# Patient Record
Sex: Male | Born: 1935 | Race: White | Hispanic: No | Marital: Married | Smoking: Never smoker
Health system: Southern US, Community
[De-identification: ages and names within clinical notes are randomized; demographics above are authoritative.]

## PROBLEM LIST (undated history)

## (undated) DIAGNOSIS — I447 Left bundle-branch block, unspecified: Secondary | ICD-10-CM

## (undated) DIAGNOSIS — C61 Malignant neoplasm of prostate: Secondary | ICD-10-CM

## (undated) DIAGNOSIS — M1990 Unspecified osteoarthritis, unspecified site: Secondary | ICD-10-CM

## (undated) DIAGNOSIS — I2510 Atherosclerotic heart disease of native coronary artery without angina pectoris: Secondary | ICD-10-CM

## (undated) DIAGNOSIS — Z9289 Personal history of other medical treatment: Secondary | ICD-10-CM

## (undated) HISTORY — PX: ROOT CANAL: SHX2363

## (undated) HISTORY — PX: TONSILLECTOMY: SUR1361

## (undated) HISTORY — PX: LASIK: SHX215

## (undated) HISTORY — PX: APPENDECTOMY: SHX54

## (undated) HISTORY — PX: RADIOACTIVE SEED IMPLANT: SHX5150

## (undated) HISTORY — DX: Personal history of other medical treatment: Z92.89

## (undated) HISTORY — PX: CATARACT EXTRACTION: SUR2

## (undated) HISTORY — DX: Malignant neoplasm of prostate: C61

---

## 1999-09-06 ENCOUNTER — Other Ambulatory Visit: Admission: RE | Admit: 1999-09-06 | Discharge: 1999-09-06 | Payer: Self-pay | Admitting: Urology

## 1999-10-24 ENCOUNTER — Ambulatory Visit (HOSPITAL_COMMUNITY): Admission: RE | Admit: 1999-10-24 | Discharge: 1999-10-24 | Payer: Self-pay | Admitting: Gastroenterology

## 1999-10-29 ENCOUNTER — Inpatient Hospital Stay (HOSPITAL_COMMUNITY): Admission: EM | Admit: 1999-10-29 | Discharge: 1999-10-31 | Payer: Self-pay | Admitting: *Deleted

## 2000-01-23 ENCOUNTER — Other Ambulatory Visit: Admission: RE | Admit: 2000-01-23 | Discharge: 2000-01-23 | Payer: Self-pay | Admitting: Urology

## 2000-07-01 ENCOUNTER — Other Ambulatory Visit: Admission: RE | Admit: 2000-07-01 | Discharge: 2000-07-01 | Payer: Self-pay | Admitting: Urology

## 2000-09-22 ENCOUNTER — Ambulatory Visit (HOSPITAL_COMMUNITY): Admission: RE | Admit: 2000-09-22 | Discharge: 2000-09-22 | Payer: Self-pay | Admitting: Radiation Oncology

## 2000-10-10 ENCOUNTER — Encounter: Payer: Self-pay | Admitting: Urology

## 2000-10-10 ENCOUNTER — Ambulatory Visit (HOSPITAL_COMMUNITY): Admission: RE | Admit: 2000-10-10 | Discharge: 2000-10-10 | Payer: Self-pay | Admitting: Radiation Oncology

## 2000-10-17 ENCOUNTER — Ambulatory Visit (HOSPITAL_BASED_OUTPATIENT_CLINIC_OR_DEPARTMENT_OTHER): Admission: RE | Admit: 2000-10-17 | Discharge: 2000-10-17 | Payer: Self-pay | Admitting: Urology

## 2000-10-17 ENCOUNTER — Encounter: Payer: Self-pay | Admitting: Urology

## 2000-11-07 ENCOUNTER — Ambulatory Visit (HOSPITAL_COMMUNITY): Admission: RE | Admit: 2000-11-07 | Discharge: 2000-11-07 | Payer: Self-pay | Admitting: Radiation Oncology

## 2000-11-07 ENCOUNTER — Encounter: Admission: RE | Admit: 2000-11-07 | Discharge: 2001-02-24 | Payer: Self-pay | Admitting: Radiation Oncology

## 2005-12-18 ENCOUNTER — Other Ambulatory Visit: Admission: RE | Admit: 2005-12-18 | Discharge: 2005-12-18 | Payer: Self-pay | Admitting: Otolaryngology

## 2011-08-30 ENCOUNTER — Emergency Department (HOSPITAL_COMMUNITY)
Admission: EM | Admit: 2011-08-30 | Discharge: 2011-08-31 | Disposition: A | Discharge status: Home / Self Care | Payer: Medicare Other | Attending: Emergency Medicine | Admitting: Emergency Medicine

## 2011-08-30 ENCOUNTER — Emergency Department (INDEPENDENT_AMBULATORY_CARE_PROVIDER_SITE_OTHER): Payer: Medicare Other

## 2011-08-30 ENCOUNTER — Emergency Department (HOSPITAL_COMMUNITY): Payer: Medicare Other

## 2011-08-30 ENCOUNTER — Other Ambulatory Visit: Payer: Self-pay

## 2011-08-30 ENCOUNTER — Encounter (HOSPITAL_COMMUNITY): Payer: Self-pay | Admitting: *Deleted

## 2011-08-30 ENCOUNTER — Emergency Department (INDEPENDENT_AMBULATORY_CARE_PROVIDER_SITE_OTHER)
Admission: EM | Admit: 2011-08-30 | Discharge: 2011-08-30 | Disposition: A | Discharge status: Other Acute Inpatient Hospital | Payer: Medicare Other | Source: Home / Self Care

## 2011-08-30 DIAGNOSIS — M25519 Pain in unspecified shoulder: Secondary | ICD-10-CM | POA: Insufficient documentation

## 2011-08-30 DIAGNOSIS — R0789 Other chest pain: Secondary | ICD-10-CM

## 2011-08-30 DIAGNOSIS — R079 Chest pain, unspecified: Secondary | ICD-10-CM | POA: Insufficient documentation

## 2011-08-30 DIAGNOSIS — R071 Chest pain on breathing: Secondary | ICD-10-CM

## 2011-08-30 DIAGNOSIS — M79609 Pain in unspecified limb: Secondary | ICD-10-CM | POA: Insufficient documentation

## 2011-08-30 DIAGNOSIS — Z7982 Long term (current) use of aspirin: Secondary | ICD-10-CM | POA: Insufficient documentation

## 2011-08-30 LAB — DIFFERENTIAL
Basophils Absolute: 0 10*3/uL (ref 0.0–0.1)
Basophils Relative: 0 % (ref 0–1)
Eosinophils Absolute: 0.1 10*3/uL (ref 0.0–0.7)
Neutrophils Relative %: 57 % (ref 43–77)

## 2011-08-30 LAB — CBC
MCH: 31.9 pg (ref 26.0–34.0)
MCHC: 35.1 g/dL (ref 30.0–36.0)
Platelets: 245 10*3/uL (ref 150–400)
RBC: 4.64 MIL/uL (ref 4.22–5.81)
RDW: 12.6 % (ref 11.5–15.5)

## 2011-08-30 LAB — BASIC METABOLIC PANEL
GFR calc Af Amer: 87 mL/min — ABNORMAL LOW (ref >90)
GFR calc non Af Amer: 75 mL/min — ABNORMAL LOW (ref >90)
Potassium: 4.9 mEq/L (ref 3.5–5.1)
Sodium: 140 mEq/L (ref 135–145)

## 2011-08-30 LAB — CARDIAC PANEL(CRET KIN+CKTOT+MB+TROPI)
CK, MB: 3.7 ng/mL (ref 0.3–4.0)
Troponin I: <0.3 ng/mL (ref <0.30)

## 2011-08-30 NOTE — ED Notes (Signed)
patient c/o pain in left flank , left elbow w motion; c/o pain wakes him ~ 3 am every night, and he takes an ASA and goes back to bed ; last night he had pain w ROM, denies N/v/SOB w his discomfort; NAD at present

## 2011-08-30 NOTE — ED Notes (Signed)
Pt reports left shoulder/arm pain that occurs in the a.m. For approx 3 weeks, pt states pain improves over the course of the day. Pt reports this a.m. He awoke w/ the same pain to left shoulder and arm at which point he went to [facility]. Pt denies any diaphoresis, n/v, or shortness of breath w/ these episodes of pain. Pt in no acute distress at present, resp even and unlabored. Pt referred here from [facility] d/t BBB on EKG which has changed from his EKG in 2001.

## 2011-08-30 NOTE — ED Notes (Addendum)
Patient reports duration of several weekss pain in left flank/left lateral chest and pain in left arm that is worse w movement; NAD, w/d/color good

## 2011-08-30 NOTE — ED Provider Notes (Signed)
History     CSN: [id number] Arrival date & time: 08/30/2011  7:27 PM   First MD Initiated Contact with Patient 08/30/11 1933      Chief Complaint  Patient presents with  . Referral    (Consider location/radiation/quality/duration/timing/severity/associated sxs/prior treatment) The history is provided by the spouse and the patient.   the patient is an 77 year old male, with no history of coronary artery disease, diabetes, or hypertension, who presents to the emergency department with left-sided chest pain.  For the past several days.  It usually occurs in the morning.  He states it is dull.  This morning.  It radiated into his left shoulder and arm.  He was seen by her primary care physician, who did the EKG showing a left bundle branch block, and he was sent here for further evaluation.  The patient denies any symptoms at this time.  The patient denies nausea, vomiting, shortness of breath, or diaphoresis.  When he is having the chest pain.  Chest pain, generally goes away if he takes aspirin or goes back to sleep.  He denies a cough, fevers, chills, leg pain or swelling.  He denies recent travel or surgery.  He takes no medications except for aspirin.  Past Medical History  Diagnosis Date  . Cancer     Past Surgical History  Procedure Date  . Appendectomy     No family history on file.  History  Substance Use Topics  . Smoking status: Not on file  . Smokeless tobacco: Not on file  . Alcohol Use:       Review of Systems  Constitutional: Negative for fever and diaphoresis.  HENT: Negative for neck pain.   Eyes: Negative for redness.  Respiratory: Negative for cough, chest tightness and shortness of breath.   Cardiovascular: Positive for chest pain. Negative for palpitations.  Gastrointestinal: Negative for nausea, vomiting, abdominal pain and diarrhea.  Musculoskeletal: Negative for back pain.  Neurological: Negative for headaches.  Psychiatric/Behavioral: Negative for  confusion.    Allergies  Penicillins and Codeine  Home Medications   Current Outpatient Rx  Name Route Sig Dispense Refill  . ASPIRIN 81 MG PO TABS Oral Take 81 mg by mouth daily.      . OMEGA-3 FATTY ACIDS 1000 MG PO CAPS Oral Take 1 g by mouth daily.      Marland Kitchen VITAMIN D (ERGOCALCIFEROL) PO Oral Take 1 tablet by mouth daily.      Marland Kitchen VITAMIN E COMPLETE PO Oral Take 1 tablet by mouth daily.        BP 156/79  Pulse 75  Temp(Src) 98.5 F (36.9 C) (Oral)  Resp 16  SpO2 96%  Physical Exam  Constitutional: He is oriented to person, place, and time. He appears well-developed and well-nourished. No distress.  HENT:  Head: Normocephalic and atraumatic.  Eyes: EOM are normal. Pupils are equal, round, and reactive to light.  Neck: Normal range of motion. Neck supple. No JVD present.       No carotid bruits  Cardiovascular: Normal rate, regular rhythm and normal heart sounds.   No murmur heard. Pulmonary/Chest: Effort normal and breath sounds normal. No respiratory distress. He has no wheezes. He has no rales.  Abdominal: Soft. Bowel sounds are normal. He exhibits no distension and no mass. There is no tenderness. There is no rebound and no guarding.  Musculoskeletal: Normal range of motion. He exhibits no edema and no tenderness.  Neurological: He is alert and oriented to person, place, and  time. No cranial nerve deficit.  Skin: Skin is warm and dry. He is not diaphoretic.  Psychiatric: He has a normal mood and affect. His behavior is normal.    ED Course  Procedures (including critical care time)  77 year old male, with no significant past medical history presents with left-sided chest pain that radiates to his left shoulder and arm.  No associated symptoms.  EKG shows a left bundle branch block.  It is unknown whether that this is new or not.  He is asymptomatic now.  We will perform cardiac enzymes, a chest x-ray, and routine laboratory tests.  Depending on the patient's condition and  results of these tests.  We will decide upon further treatment and disposition.  ED ECG REPORT   Date: 08/30/2011  EKG Time: 19:33 Rate: 75  Rhythm: normal sinus rhythm,  normal sinus rhythm, LBBB, 1st degree AV block  Axis: nl  Intervals:first-degree A-V block   ST&T Change: nonspecific  Narrative Interpretation: Normal sinus rhythm with first degree AV block and left bundle branch block            Labs Reviewed  BASIC METABOLIC PANEL - Abnormal; Notable for the following:    Glucose, Bld 119 (*)    GFR calc non Af Amer 75 (*)    GFR calc Af Amer 87 (*)    All other components within normal limits  CBC  DIFFERENTIAL  CARDIAC PANEL(CRET KIN+CKTOT+MB+TROPI)  POCT I-STAT TROPONIN I  I-STAT TROPONIN I   Dg Chest 2 View  08/30/2011  *RADIOLOGY REPORT*  Clinical Data: 77 year old male with left-sided chest pain.  CHEST - 2 VIEW  Comparison: None  Findings: The cardiomediastinal silhouette is unremarkable. There is no evidence of focal airspace disease, pulmonary edema, suspicious pulmonary nodule/mass, pleural effusion, or pneumothorax. No acute bony abnormalities are identified.  IMPRESSION: No evidence of active cardiopulmonary disease.  Original Report Authenticated By: Rosendo Gros, M.D.   Dg Ribs Unilateral W/chest Left  08/30/2011  *RADIOLOGY REPORT*  Clinical Data: Left subcapsular and rib pain worse at night  LEFT RIBS AND CHEST - 3+ VIEW  Comparison: None  Findings: Upper-normal size of cardiac silhouette. Mediastinal contours and pulmonary vascularity normal. Calcified granuloma left mid lung. Lungs otherwise clear. No pleural effusion or pneumothorax. Bones demineralized. BBs placed at sites of symptoms lower left chest. No rib fracture or bone destruction.  IMPRESSION: No acute abnormalities.  Original Report Authenticated By: Lollie Marrow, M.D.     10:04 PM Went back to get more hx.  He says when pain is present he is aware of it for only approx. 15 min.   The he  turns onto his right side, and he falls asleep.    PCP is Dr. Lupe Carney.  12:18 AM I spoke with the cardiologist on call.  I explained the presenting symptoms and findings.  I suggested that the patient get an outpatient.  Followup in the office given the fact that I have a very low suspicion that the patient is having in acute coronary syndrome.  The cardiologist agreed with me, based upon my description.  He said that the [facility] cardiologist would call to arrange an appointment on Monday.  I explained this to the patient and family, they understand and agree with the plan to   MDM  Left-sided chest pain in an 77 year old male, with no coronary risk factors.  The pain lasts only 15 minutes and is relieved.  If he lies on his right side.  He has no other associated symptoms.  He has a left bundle branch block, which cannot be compared to a previous EKG.  His cardiac enzymes, are normal.  His chest x-ray is normal.        Nicholes Stairs, MD 08/31/11 0020

## 2011-08-30 NOTE — ED Notes (Signed)
Per EMS - pt transferred here from [facility] d/t BBB noted on pt's EKG. Pt went to [facility] d/t intermittent left chest pain and arm pain that awoke pt from his sleep this a.m., pt admits this has been occurring for approx 3 weeks - pt states once he moved around the pain subsided. Pt denies any pain at present, no diaphoresis, n/v, or shortness of breath.

## 2011-08-30 NOTE — ED Provider Notes (Signed)
History     CSN: [id number] Arrival date & time: 08/30/2011  4:10 PM   First MD Initiated Contact with Patient 08/30/11 1606      Chief Complaint  Patient presents with  . Muscle Pain    (Consider location/radiation/quality/duration/timing/severity/associated sxs/prior treatment) HPI Comments: 77 y/o male usually in good health. Has yearly check ups. No chronic medications or chronic illness other that on and off osteoarthritic type of pain.   Patient is a 77 y.o. male presenting with musculoskeletal pain. The history is provided by the patient.  Muscle Pain Chronicity: Left side chest wall pain and fore arm  for over 3 weeks started after lifting heavy plants about 1 month ago.  The problem occurs constantly (pain in arm and side is contant but increased with movement. Woke up patient from sleep today at 3 am.). The problem has not changed since onset.Associated symptoms include chest pain. Pertinent negatives include no abdominal pain, no headaches and no shortness of breath. Associated symptoms comments: No diaphoresis or anxiety. . Exacerbated by: touching the tender area and by lifting with left arm or sleeping on left side. The symptoms are relieved by ASA. He has tried ASA for the symptoms. The treatment provided mild relief.    Past Medical History  Diagnosis Date  . Cancer     Past Surgical History  Procedure Date  . Appendectomy     History reviewed. No pertinent family history.  History  Substance Use Topics  . Smoking status: Not on file  . Smokeless tobacco: Not on file  . Alcohol Use:       Review of Systems  Constitutional: Negative.   Respiratory: Negative for cough, chest tightness and shortness of breath.   Cardiovascular: Positive for chest pain. Negative for palpitations and leg swelling.  Gastrointestinal: Negative for abdominal pain.  Neurological: Negative for dizziness, numbness and headaches.    Allergies  Penicillins and Codeine  Home  Medications   Current Outpatient Rx  Name Route Sig Dispense Refill  . ASPIRIN 81 MG PO TABS Oral Take 81 mg by mouth daily.      . OMEGA-3 FATTY ACIDS 1000 MG PO CAPS Oral Take 1 g by mouth daily.      Marland Kitchen VITAMIN D (ERGOCALCIFEROL) PO Oral Take 1 tablet by mouth daily.      Marland Kitchen VITAMIN E COMPLETE PO Oral Take 1 tablet by mouth daily.        BP 139/79  Pulse 67  Temp(Src) 97.5 F (36.4 C) (Oral)  Resp 18  SpO2 97%  Physical Exam  Nursing note and vitals reviewed. Constitutional: He is oriented to person, place, and time. He appears well-developed and well-nourished. No distress.  Neck: No JVD present.  Cardiovascular: Normal rate, regular rhythm and normal heart sounds.  Exam reveals no gallop and no friction rub.   No murmur heard. Pulmonary/Chest: Effort normal and breath sounds normal. He has no rales. He exhibits tenderness.  Musculoskeletal: He exhibits no edema.       Pain in subscapular area worse with arm abduction against resistance. Also pain with palpation for fore arm with pronation and supination against resistance.  Neurological: He is alert and oriented to person, place, and time.  Skin: Skin is warm.    ED Course  Procedures (including critical care time)  Labs Reviewed - No data to display Dg Chest 2 View  08/30/2011  *RADIOLOGY REPORT*  Clinical Data: 77 year old male with left-sided chest pain.  CHEST - 2 VIEW  Comparison: None  Findings: The cardiomediastinal silhouette is unremarkable. There is no evidence of focal airspace disease, pulmonary edema, suspicious pulmonary nodule/mass, pleural effusion, or pneumothorax. No acute bony abnormalities are identified.  IMPRESSION: No evidence of active cardiopulmonary disease.  Original Report Authenticated By: Rosendo Gros, M.D.   Dg Ribs Unilateral W/chest Left  08/30/2011  *RADIOLOGY REPORT*  Clinical Data: Left subcapsular and rib pain worse at night  LEFT RIBS AND CHEST - 3+ VIEW  Comparison: None  Findings:  Upper-normal size of cardiac silhouette. Mediastinal contours and pulmonary vascularity normal. Calcified granuloma left mid lung. Lungs otherwise clear. No pleural effusion or pneumothorax. Bones demineralized. BBs placed at sites of symptoms lower left chest. No rib fracture or bone destruction.  IMPRESSION: No acute abnormalities.  Original Report Authenticated By: Lollie Marrow, M.D.     No diagnosis found.    MDM  EKG: Rate 60s, sinus rhitm, No Q waves or ST changes, LBBB. Compared with prior EKG and LBBB is new finding. Impress left side chest pain musculoskeletal  in nature but given that LBBB is new and could mask other ischemic changes in EKG decided to transfer to the ED to r/o coronary syndrome.        Sharin Grave, MD 09/01/11 1224

## 2011-08-30 NOTE — ED Notes (Signed)
Called to [facility], no truck; called [facility], will send truck ASAP; Called CN, to advise of pending transfer via ambulance

## 2011-08-31 NOTE — ED Notes (Signed)
D/c'd by Florentina Addison, RN

## 2011-09-02 ENCOUNTER — Telehealth: Payer: Self-pay | Admitting: Cardiology

## 2011-09-02 ENCOUNTER — Telehealth (HOSPITAL_COMMUNITY): Payer: Self-pay | Admitting: *Deleted

## 2011-09-02 NOTE — Telephone Encounter (Signed)
Pt called to make fu appt from ER for arm pain, said he was not having any problems now and all test from the hospital were normal and not sure why he even needed to fu up at all but he made appt 12-7 , his wife made him call back to get something sooner, there isn't anything sooner with [facility] or anyone else, offered waitlist declined, wants a nurse call

## 2011-09-02 NOTE — ED Notes (Signed)
Pt called this afternoon, stating that he wanted to talk with Dr. Alfonse Ras.  States she had seen him on Friday and transferred him to the ED.  He states he was discharged from the ED with the instructions to get an appt with the cardiologist.  States he has one for 09/13/11 but his wife is concerned that he needs to be seen sooner.  I told him that I would talk with Dr. Alfonse Ras when she gets to work at [employer].  I spoke with Dr. Alfonse Ras and she had already reviewed his visit from the ED and was well aware of his status.  She states the appt time is appropriate.  I called pt back and relayed this to him.  He voiced understanding and his appreciation.

## 2011-09-03 NOTE — Telephone Encounter (Signed)
Left message for pt of okay to wait until 09-13-11. He will call with any concerns or problems prior to the appt Raymond Hinton

## 2011-09-04 ENCOUNTER — Encounter: Payer: Self-pay | Admitting: Nurse Practitioner

## 2011-09-04 ENCOUNTER — Telehealth: Payer: Self-pay | Admitting: Cardiology

## 2011-09-04 ENCOUNTER — Ambulatory Visit (INDEPENDENT_AMBULATORY_CARE_PROVIDER_SITE_OTHER): Payer: Medicare Other | Admitting: Nurse Practitioner

## 2011-09-04 VITALS — BP 128/68 | HR 76 | Ht 69.5 in | Wt 156.8 lb

## 2011-09-04 DIAGNOSIS — I447 Left bundle-branch block, unspecified: Secondary | ICD-10-CM

## 2011-09-04 DIAGNOSIS — R011 Cardiac murmur, unspecified: Secondary | ICD-10-CM

## 2011-09-04 DIAGNOSIS — R0789 Other chest pain: Secondary | ICD-10-CM

## 2011-09-04 NOTE — Progress Notes (Signed)
Raymond Hinton Date of Birth: [birth year] Medical Record #[id number]  History of Present Illness: Raymond Hinton is seen today for a work in visit. He is seen for Dr. Jens Som. He has never had cardiology evaluation. He does not report a history of coronary disease, diabetes, hyperlipidemia or stroke. He is an 77 year old male who presented with chest pain to the Urgent Care and was transferred to the ER on Thanksgiving day. He notes that about 3 weeks ago he started noticing some weak spells. They would pass without any treatment. He did think this was a little unusual for him. He then started waking up about 3 am with pain in his left side. It was somewhat dull. He would get up and take an aspirin and go back to sleep. When he woke up at his regular time at 5 am, he was fine. This would not happen every night but rather every couple of nights. This started after lifting some heavy plants. On Thanksgiving, he lifted his heavy Christmas tree box. The next morning, he awoke at 3 am with his whole left arm and shoulder hurting.  The pain was constant and aggravated by movement. He called his PCP on Friday but was referred to the Urgent Care. There he was evaluated and had an EKG. He had a LBBB. This was reported as new. We do not have a prior EKG to compare with. His rib xrays showed upper normal size of the cardiac silhouette. He has a calcified granuloma in the left mid lung. Lungs were clear. No fractures noted. He was transferred to the ED. Enzymes were negative. He was told to follow up here. He continues to have some intermittent left arm pain as well as the pain in his left side. The pain is worse with movement.   Current Outpatient Prescriptions on File Prior to Visit  Medication Sig Dispense Refill  . aspirin 81 MG tablet Take 81 mg by mouth daily.        . fish oil-omega-3 fatty acids 1000 MG capsule Take 1 g by mouth daily.        Marland Kitchen VITAMIN D, ERGOCALCIFEROL, PO Take 1 tablet by mouth daily.          . Vitamin Mixture (VITAMIN E COMPLETE PO) Take 1 tablet by mouth daily.          Allergies  Allergen Reactions  . Penicillins Anaphylaxis  . Codeine Other (See Comments)    Patient states that it makes him go "crazy"    Past Medical History  Diagnosis Date  . Cancer     Past Surgical History  Procedure Date  . Appendectomy   . Radioactive seed implant   . Root canal     History  Smoking status  . Never Smoker   Smokeless tobacco  . Not on file    History  Alcohol Use No    Family History  Problem Relation Age of Onset  . Heart attack Mother   . Heart attack Sister   . Heart disease Sister   . Cancer Brother   . Lung cancer Sister     Review of Systems: The review of systems is positive for some new onset of fatigue. No actual chest pain. He is not short of breath. No dizziness. No syncope.  All other systems were reviewed and are negative.  Physical Exam: Ht 5' 9.5" (1.765 m)  Wt 156 lb 12.8 oz (71.124 kg)  BMI 22.82 kg/m2 Patient is very  pleasant and in no acute distress. Skin is warm and dry. Color is normal.  HEENT is unremarkable. Normocephalic/atraumatic. PERRL. Sclera are nonicteric. Neck is supple. No masses. No JVD. Lungs are clear. Cardiac exam shows a regular rate and rhythm. Soft systolic murmur noted at the apex. Abdomen is soft. Extremities are without edema. Gait and ROM are intact. No gross neurologic deficits noted.   Lab Results  Component Value Date   WBC 6.2 08/30/2011   HGB 14.8 08/30/2011   HCT 42.2 08/30/2011   PLT 245 08/30/2011   GLUCOSE 119* 08/30/2011   NA 140 08/30/2011   K 4.9 08/30/2011   CL 102 08/30/2011   CREATININE 0.96 08/30/2011   BUN 15 08/30/2011   CO2 29 08/30/2011   Lab Results  Component Value Date   CKTOTAL 80 08/30/2011   CKMB 3.7 08/30/2011   TROPONINI <0.30 08/30/2011   EKG here today shows a LBBB with sinus rhythm.   Assessment / Plan:

## 2011-09-04 NOTE — Assessment & Plan Note (Signed)
Will check an echocardiogram to evaluate further.

## 2011-09-04 NOTE — Patient Instructions (Addendum)
We are going to arrange for a stress test this week. We are also going to get an ultrasound of your heart to evaluate your murmur.   Stay on your aspirin each day.   Call the [facility] office at [phone] if you have any questions, problems or concerns.

## 2011-09-04 NOTE — Assessment & Plan Note (Addendum)
The patient is subsequently seen and evaluated with Dr. Jens Som. His arm and side pain do not sound cardiac in origin. It would appear that he has two separate problems. We will obtain a Lexiscan because of the left bundle.  He may stay on his aspirin. Probably needs to see his PCP regarding the arm and side pain.   Patient seen and examined. Agree with the above. Patient complains of left side pain mid rib area. It occurs at approximately 3 AM and is described as a dull sensation. He takes an aspirin and goes back to bed. When he awakens the pain has resolved. He has also had some pain in his left shoulder and left upper extremity that increases with movements. Patient noted to have a new left bundle branch block and referred to cardiology. He denies dyspnea on exertion, orthopnea, PND or exertional chest pain. No syncope. Chest x-ray negative and rib films negative. Symptoms atypical. Question musculoskeletal. Given left bundle branch block we'll plan to proceed with a Myoview for risk stratification. Patient also noted to have a mitral regurgitation murmur on examination. Check echocardiogram. Followup with me in 4-6 weeks.

## 2011-09-04 NOTE — Telephone Encounter (Signed)
Discussed with dr Jens Som, pt will see lori gerhardt np today at 3pm Deliah Goody

## 2011-09-04 NOTE — Telephone Encounter (Signed)
Spoke with pt, he called to report feeling bad yesterday. He woke around 3 am with left arm and shoulder pain, with a funny feeling in his left side. He took some asa and was able to return to sleep. The discomfort has returned and has been constant for the last 2-3 hours. The discomfort is gone now and is coming intermittently. Pt denies SOB. He states when the pain stops he can move his wrist and hand and get a funny feeling in the left side. Will discuss with dr Jens Som. Deliah Goody

## 2011-09-04 NOTE — Assessment & Plan Note (Signed)
This is apparently new. We do not have an old EKG to compare with. We will obtain a Lexiscan.

## 2011-09-04 NOTE — Telephone Encounter (Signed)
Pt caliing today with arm pain that started yesterday and wants to be seen today

## 2011-09-10 ENCOUNTER — Telehealth: Payer: Self-pay | Admitting: Cardiology

## 2011-09-10 NOTE — Telephone Encounter (Signed)
New Msg: Pt calling wanting to know if pt needs to keep appt on Friday 12/7. Please return pt call to discuss further.   Please also call pt office [phone] and talk to Mary-pt assistant if pt will be having appt on 12/7.

## 2011-09-10 NOTE — Telephone Encounter (Signed)
Spoke with Raymond Hinton, pt is having his stress test and echo tomorrow. He will decide if he wants to come on Friday to discuss results. He will call back if he wants to reschedule Raymond Hinton

## 2011-09-11 ENCOUNTER — Ambulatory Visit (HOSPITAL_COMMUNITY): Payer: Medicare Other | Attending: Cardiovascular Disease | Admitting: Radiology

## 2011-09-11 DIAGNOSIS — R0789 Other chest pain: Secondary | ICD-10-CM

## 2011-09-11 DIAGNOSIS — R42 Dizziness and giddiness: Secondary | ICD-10-CM | POA: Insufficient documentation

## 2011-09-11 DIAGNOSIS — I059 Rheumatic mitral valve disease, unspecified: Secondary | ICD-10-CM | POA: Insufficient documentation

## 2011-09-11 DIAGNOSIS — I379 Nonrheumatic pulmonary valve disorder, unspecified: Secondary | ICD-10-CM | POA: Insufficient documentation

## 2011-09-11 DIAGNOSIS — I447 Left bundle-branch block, unspecified: Secondary | ICD-10-CM | POA: Insufficient documentation

## 2011-09-11 DIAGNOSIS — R9431 Abnormal electrocardiogram [ECG] [EKG]: Secondary | ICD-10-CM | POA: Insufficient documentation

## 2011-09-11 DIAGNOSIS — I359 Nonrheumatic aortic valve disorder, unspecified: Secondary | ICD-10-CM | POA: Insufficient documentation

## 2011-09-11 DIAGNOSIS — R072 Precordial pain: Secondary | ICD-10-CM | POA: Insufficient documentation

## 2011-09-11 DIAGNOSIS — R011 Cardiac murmur, unspecified: Secondary | ICD-10-CM

## 2011-09-11 DIAGNOSIS — M79609 Pain in unspecified limb: Secondary | ICD-10-CM | POA: Insufficient documentation

## 2011-09-11 DIAGNOSIS — R079 Chest pain, unspecified: Secondary | ICD-10-CM

## 2011-09-11 DIAGNOSIS — Z8249 Family history of ischemic heart disease and other diseases of the circulatory system: Secondary | ICD-10-CM | POA: Insufficient documentation

## 2011-09-11 DIAGNOSIS — R5381 Other malaise: Secondary | ICD-10-CM | POA: Insufficient documentation

## 2011-09-11 MED ORDER — TECHNETIUM TC 99M TETROFOSMIN IV KIT
11.0000 | PACK | Freq: Once | INTRAVENOUS | Status: AC | PRN
  Administered 2011-09-11: 11 via INTRAVENOUS

## 2011-09-11 MED ORDER — TECHNETIUM TC 99M TETROFOSMIN IV KIT
33.0000 | PACK | Freq: Once | INTRAVENOUS | Status: AC | PRN
  Administered 2011-09-11: 33 via INTRAVENOUS

## 2011-09-11 MED ORDER — ADENOSINE (DIAGNOSTIC) 3 MG/ML IV SOLN
0.5600 mg/kg | Freq: Once | INTRAVENOUS | Status: AC
  Administered 2011-09-11: 39.9 mg via INTRAVENOUS

## 2011-09-11 NOTE — Progress Notes (Signed)
[facility] SITE 3 NUCLEAR MED [address] [place] [address] [phone]  Cardiology Nuclear Med Raymond Hinton  Raymond Hinton is a 77 y.o. male [id number] 1927-06-30   Nuclear Med Background Indication for Stress Test:  Evaluation for Ischemia and 08/30/11 Post Hospital:CP,and Abnormal EKG (new LBBB) History:  No previous documented CAD Cardiac Risk Factors:Premature Family History - CAD and LBBB  Symptoms:  Chest Pain,( last chest discomfort one week ago), Left arm pain with arm movement, Dizziness, Fatigue and Fatigue with Exertion   Nuclear Pre-Procedure Caffeine/Decaff Intake:  None NPO After: 4:30am   Lungs:  Clear IV 0.9% NS with Angio Cath:  22g  IV Site: R Antecubital  IV Started by:  Bonnita Levan, RN  Chest Size (in):  44 Cup Size: n/a  Height: 5\' 9"  (1.753 m)  Weight:  157 lb (71.215 kg)  BMI:  Body mass index is 23.18 kg/(m^2). Tech Comments:  N/A    Nuclear Med Study 1 or 2 day study: 1 day  Stress Test Type:  Adenosine  Reading MD: Kristeen Miss, MD  Order Authorizing Provider:  Olga Millers, MD  Resting Radionuclide: Technetium 93m Tetrofosmin  Resting Radionuclide Dose: 11.0 mCi   Stress Radionuclide:  Technetium 86m Tetrofosmin  Stress Radionuclide Dose: 33.0 mCi           Stress Protocol Rest HR: 66 Stress HR: 92  Rest BP: 139/72 Stress BP: 156/72  Exercise Time (min): n/a METS: n/a   Predicted Max HR: 138 bpm % Max HR: 66.67 bpm Rate Pressure Product: 27253   Dose of Adenosine (mg):  40 Dose of Lexiscan: n/a mg  Dose of Atropine (mg): n/a Dose of Dobutamine: n/a mcg/kg/min (at max HR)  Stress Test Technologist: Irean Hong, RN  Nuclear Technologist:  Domenic Polite, CNMT     Rest Procedure:  Myocardial perfusion imaging was performed at rest 45 minutes following the intravenous administration of Technetium 74m Tetrofosmin. Rest ECG: NSR with 1st  AVB, LBBB  Stress Procedure:  The patient received IV adenosine at 140  mcg/kg/min for 4 minutes.  The EKG was non-diagnostic due to baseline LBBB. There was a rare PVC. Technetium 24m Tetrofosmin was injected at the 2 minute mark and quantitative spect images were obtained after a 45 minute delay. Stress ECG: No significant change from baseline ECG  QPS Raw Data Images:  Normal; no motion artifact; normal heart/lung ratio. Stress Images:  There is very mild attenuation of the entire septal wall with normal uptake in the remaining areas. Rest Images:  There is very mild attenuation of the entire septal wall with normal uptake in the remaining areas Subtraction (SDS):  No evidence of ischemia. Transient Ischemic Dilatation (Normal <1.22):  0.96 Lung/Heart Ratio (Normal <0.45):  0.31  Quantitative Gated Spect Images QGS EDV:  84 ml QGS ESV:  27 ml QGS cine images:  NL LV Function; NL Wall Motion QGS EF: 68%  Impression Exercise Capacity:  Adenosine study with no exercise. BP Response:  Normal blood pressure response. Clinical Symptoms:  No chest pain. ECG Impression:  No significant ST segment change suggestive of ischemia. Comparison with Prior Nuclear Study: No previous nuclear study performed  Overall Impression:  Low risk stress nuclear study.  There is no evidence of ischemia.  There is some attenuation of the entire septum at rest and with exertion.  This area seems to contract well and I doubt that this represents a subendocardial MI.  The LV function is normal with an  EF of 68 %.    Vesta Mixer, Montez Hageman., MD, [facility] 09/11/2011, 6:09 PM

## 2011-09-13 ENCOUNTER — Ambulatory Visit: Payer: Medicare Other | Admitting: Cardiology

## 2012-03-16 DIAGNOSIS — C61 Malignant neoplasm of prostate: Secondary | ICD-10-CM | POA: Diagnosis not present

## 2012-06-10 DIAGNOSIS — T148XXA Other injury of unspecified body region, initial encounter: Secondary | ICD-10-CM | POA: Diagnosis not present

## 2012-06-10 DIAGNOSIS — L819 Disorder of pigmentation, unspecified: Secondary | ICD-10-CM | POA: Diagnosis not present

## 2012-06-10 DIAGNOSIS — L570 Actinic keratosis: Secondary | ICD-10-CM | POA: Diagnosis not present

## 2012-06-10 DIAGNOSIS — L821 Other seborrheic keratosis: Secondary | ICD-10-CM | POA: Diagnosis not present

## 2012-07-29 DIAGNOSIS — L570 Actinic keratosis: Secondary | ICD-10-CM | POA: Diagnosis not present

## 2012-09-21 DIAGNOSIS — Z79899 Other long term (current) drug therapy: Secondary | ICD-10-CM | POA: Diagnosis not present

## 2012-09-21 DIAGNOSIS — R1310 Dysphagia, unspecified: Secondary | ICD-10-CM | POA: Diagnosis not present

## 2012-09-21 DIAGNOSIS — M159 Polyosteoarthritis, unspecified: Secondary | ICD-10-CM | POA: Diagnosis not present

## 2012-12-30 DIAGNOSIS — L570 Actinic keratosis: Secondary | ICD-10-CM | POA: Diagnosis not present

## 2012-12-30 DIAGNOSIS — D235 Other benign neoplasm of skin of trunk: Secondary | ICD-10-CM | POA: Diagnosis not present

## 2013-03-25 DIAGNOSIS — Z961 Presence of intraocular lens: Secondary | ICD-10-CM | POA: Diagnosis not present

## 2013-04-05 DIAGNOSIS — C61 Malignant neoplasm of prostate: Secondary | ICD-10-CM | POA: Diagnosis not present

## 2013-04-05 DIAGNOSIS — Z8546 Personal history of malignant neoplasm of prostate: Secondary | ICD-10-CM | POA: Diagnosis not present

## 2013-07-07 ENCOUNTER — Encounter: Payer: Self-pay | Admitting: Podiatry

## 2013-07-07 ENCOUNTER — Ambulatory Visit (INDEPENDENT_AMBULATORY_CARE_PROVIDER_SITE_OTHER): Payer: Medicare Other | Admitting: Podiatry

## 2013-07-07 ENCOUNTER — Ambulatory Visit (INDEPENDENT_AMBULATORY_CARE_PROVIDER_SITE_OTHER): Payer: Medicare Other

## 2013-07-07 VITALS — BP 150/75 | HR 67 | Resp 16 | Ht 68.0 in | Wt 158.0 lb

## 2013-07-07 DIAGNOSIS — M779 Enthesopathy, unspecified: Secondary | ICD-10-CM

## 2013-07-07 DIAGNOSIS — M216X9 Other acquired deformities of unspecified foot: Secondary | ICD-10-CM | POA: Diagnosis not present

## 2013-07-07 NOTE — Progress Notes (Signed)
N-sharp L-plantar 5th MPJ right D-several years O-gradual C-callused area, worsening A-walking T-wears "good" shoes

## 2013-07-07 NOTE — Patient Instructions (Signed)
Return when corn returns or pain

## 2013-07-07 NOTE — Progress Notes (Signed)
Subjective:     Patient ID: Raymond Hinton, male   DOB: Feb 21, 1927, 79 y.o.   MRN: [id number]  HPIpain 5 th met that is bothering. No treatment has occurred to this time   Review of Systems  All other systems reviewed and are negative.       Objective:   Physical Exam  Cardiovascular: Intact distal pulses.    neurological was found to be intact bilateral. Muscle strength was evaluated and found to be within normal limits and mild equinus condition was noted bilateral. Hair growth was found to be normal and range of motion of the subtalar and midtarsal joint was normal. I noted there to be pain in the fifth metatarsal head right with a nucleated lesion with fluid buildup underlying the lesion.     Assessment:     Plantar flexed fifth metatarsal right fifth keratotic lesion formation and fluid buildup.    Plan:     Initial H&P performed with education. Explained the causes of metatarsal declination. At this time conservative treatment consisting of steroidal injection into the plantar capsule with 3 mg of dexamethasone and 5 mg Xylocaine accomplished. After appropriate numbness aggressive debridement of lesion accomplished.

## 2013-07-08 MED ORDER — TRIAMCINOLONE ACETONIDE 10 MG/ML IJ SUSP
5.0000 mg | Freq: Once | INTRAMUSCULAR | Status: AC
  Administered 2013-07-07: 5 mg via INTRA_ARTICULAR

## 2013-07-08 NOTE — Addendum Note (Signed)
Addended by: Lenn Sink on: 07/08/2013 04:04 PM   Modules accepted: Orders

## 2013-09-15 DIAGNOSIS — Z79899 Other long term (current) drug therapy: Secondary | ICD-10-CM | POA: Diagnosis not present

## 2013-09-15 DIAGNOSIS — R1310 Dysphagia, unspecified: Secondary | ICD-10-CM | POA: Diagnosis not present

## 2013-09-15 DIAGNOSIS — M159 Polyosteoarthritis, unspecified: Secondary | ICD-10-CM | POA: Diagnosis not present

## 2013-10-24 ENCOUNTER — Encounter (HOSPITAL_COMMUNITY): Payer: Self-pay | Admitting: Internal Medicine

## 2013-10-24 ENCOUNTER — Emergency Department (HOSPITAL_COMMUNITY): Payer: Medicare Other

## 2013-10-24 ENCOUNTER — Observation Stay (HOSPITAL_COMMUNITY)
Admission: EM | Admit: 2013-10-24 | Discharge: 2013-10-25 | Disposition: A | Discharge status: Home / Self Care | Payer: Medicare Other | Attending: Internal Medicine | Admitting: Internal Medicine

## 2013-10-24 DIAGNOSIS — R0609 Other forms of dyspnea: Secondary | ICD-10-CM | POA: Diagnosis not present

## 2013-10-24 DIAGNOSIS — Z9189 Other specified personal risk factors, not elsewhere classified: Secondary | ICD-10-CM

## 2013-10-24 DIAGNOSIS — R0989 Other specified symptoms and signs involving the circulatory and respiratory systems: Secondary | ICD-10-CM | POA: Diagnosis not present

## 2013-10-24 DIAGNOSIS — D72829 Elevated white blood cell count, unspecified: Secondary | ICD-10-CM

## 2013-10-24 DIAGNOSIS — I080 Rheumatic disorders of both mitral and aortic valves: Secondary | ICD-10-CM | POA: Diagnosis not present

## 2013-10-24 DIAGNOSIS — I2510 Atherosclerotic heart disease of native coronary artery without angina pectoris: Secondary | ICD-10-CM | POA: Diagnosis not present

## 2013-10-24 DIAGNOSIS — R7309 Other abnormal glucose: Secondary | ICD-10-CM | POA: Diagnosis not present

## 2013-10-24 DIAGNOSIS — I447 Left bundle-branch block, unspecified: Secondary | ICD-10-CM | POA: Diagnosis present

## 2013-10-24 DIAGNOSIS — R55 Syncope and collapse: Secondary | ICD-10-CM | POA: Diagnosis not present

## 2013-10-24 DIAGNOSIS — R011 Cardiac murmur, unspecified: Secondary | ICD-10-CM

## 2013-10-24 DIAGNOSIS — C61 Malignant neoplasm of prostate: Secondary | ICD-10-CM | POA: Diagnosis not present

## 2013-10-24 DIAGNOSIS — Z9089 Acquired absence of other organs: Secondary | ICD-10-CM | POA: Diagnosis not present

## 2013-10-24 DIAGNOSIS — J8410 Pulmonary fibrosis, unspecified: Secondary | ICD-10-CM | POA: Diagnosis not present

## 2013-10-24 DIAGNOSIS — R61 Generalized hyperhidrosis: Secondary | ICD-10-CM | POA: Diagnosis not present

## 2013-10-24 DIAGNOSIS — R0789 Other chest pain: Secondary | ICD-10-CM

## 2013-10-24 DIAGNOSIS — R404 Transient alteration of awareness: Secondary | ICD-10-CM | POA: Diagnosis not present

## 2013-10-24 DIAGNOSIS — Z7982 Long term (current) use of aspirin: Secondary | ICD-10-CM | POA: Insufficient documentation

## 2013-10-24 HISTORY — DX: Left bundle-branch block, unspecified: I44.7

## 2013-10-24 HISTORY — DX: Unspecified osteoarthritis, unspecified site: M19.90

## 2013-10-24 LAB — CBC WITH DIFFERENTIAL/PLATELET
Basophils Absolute: 0 10*3/uL (ref 0.0–0.1)
Basophils Relative: 0 % (ref 0–1)
Eosinophils Absolute: 0.1 10*3/uL (ref 0.0–0.7)
Eosinophils Relative: 1 % (ref 0–5)
HEMATOCRIT: 40 % (ref 39.0–52.0)
Hemoglobin: 13.5 g/dL (ref 13.0–17.0)
LYMPHS ABS: 2.6 10*3/uL (ref 0.7–4.0)
LYMPHS PCT: 21 % (ref 12–46)
MCH: 30.8 pg (ref 26.0–34.0)
MCHC: 33.8 g/dL (ref 30.0–36.0)
MCV: 91.1 fL (ref 78.0–100.0)
MONO ABS: 0.9 10*3/uL (ref 0.1–1.0)
Monocytes Relative: 8 % (ref 3–12)
Neutro Abs: 8.6 10*3/uL — ABNORMAL HIGH (ref 1.7–7.7)
Neutrophils Relative %: 71 % (ref 43–77)
Platelets: 218 10*3/uL (ref 150–400)
RBC: 4.39 MIL/uL (ref 4.22–5.81)
RDW: 12.5 % (ref 11.5–15.5)
WBC: 12.2 10*3/uL — AB (ref 4.0–10.5)

## 2013-10-24 LAB — URINALYSIS, ROUTINE W REFLEX MICROSCOPIC
Bilirubin Urine: NEGATIVE
GLUCOSE, UA: NEGATIVE mg/dL
HGB URINE DIPSTICK: NEGATIVE
Ketones, ur: NEGATIVE mg/dL
Leukocytes, UA: NEGATIVE
Nitrite: NEGATIVE
PH: 6 (ref 5.0–8.0)
PROTEIN: NEGATIVE mg/dL
Specific Gravity, Urine: 1.009 (ref 1.005–1.030)
Urobilinogen, UA: 0.2 mg/dL (ref 0.0–1.0)

## 2013-10-24 LAB — TROPONIN I: Troponin I: <0.3 ng/mL (ref <0.30)

## 2013-10-24 LAB — COMPREHENSIVE METABOLIC PANEL
ALT: 14 U/L (ref 0–53)
AST: 16 U/L (ref 0–37)
Albumin: 3.2 g/dL — ABNORMAL LOW (ref 3.5–5.2)
Alkaline Phosphatase: 58 U/L (ref 39–117)
BILIRUBIN TOTAL: 1.8 mg/dL — AB (ref 0.3–1.2)
BUN: 20 mg/dL (ref 6–23)
CHLORIDE: 103 meq/L (ref 96–112)
CO2: 24 meq/L (ref 19–32)
CREATININE: 1.11 mg/dL (ref 0.50–1.35)
Calcium: 8.5 mg/dL (ref 8.4–10.5)
GFR, EST AFRICAN AMERICAN: 68 mL/min — AB (ref >90)
GFR, EST NON AFRICAN AMERICAN: 59 mL/min — AB (ref >90)
Glucose, Bld: 99 mg/dL (ref 70–99)
Potassium: 4.5 mEq/L (ref 3.7–5.3)
SODIUM: 139 meq/L (ref 137–147)
Total Protein: 6.2 g/dL (ref 6.0–8.3)

## 2013-10-24 LAB — POCT I-STAT TROPONIN I: Troponin i, poc: 0.01 ng/mL (ref 0.00–0.08)

## 2013-10-24 MED ORDER — ADULT MULTIVITAMIN W/MINERALS CH
1.0000 | ORAL_TABLET | Freq: Every day | ORAL | Status: DC
  Administered 2013-10-24 – 2013-10-25 (×2): 1 via ORAL
  Filled 2013-10-24 (×2): qty 1

## 2013-10-24 MED ORDER — ONDANSETRON HCL 4 MG/2ML IJ SOLN
4.0000 mg | Freq: Four times a day (QID) | INTRAMUSCULAR | Status: DC | PRN
Start: 2013-10-24 — End: 2013-10-25

## 2013-10-24 MED ORDER — ACETAMINOPHEN 325 MG PO TABS: 650.0000 mg | ORAL_TABLET | Freq: Four times a day (QID) | ORAL | Status: DC | PRN

## 2013-10-24 MED ORDER — ASPIRIN 81 MG PO CHEW
81.0000 mg | CHEWABLE_TABLET | Freq: Every day | ORAL | Status: DC
  Administered 2013-10-25: 81 mg via ORAL
  Filled 2013-10-24: qty 1

## 2013-10-24 MED ORDER — VITAMIN E 45 MG (100 UNIT) PO CAPS
100.0000 [IU] | ORAL_CAPSULE | Freq: Every day | ORAL | Status: DC
  Administered 2013-10-25: 100 [IU] via ORAL
  Filled 2013-10-24: qty 1

## 2013-10-24 MED ORDER — SODIUM CHLORIDE 0.9 % IJ SOLN
3.0000 mL | Freq: Two times a day (BID) | INTRAMUSCULAR | Status: DC
  Administered 2013-10-24 – 2013-10-25 (×3): 3 mL via INTRAVENOUS

## 2013-10-24 MED ORDER — ACETAMINOPHEN 650 MG RE SUPP: 650.0000 mg | Freq: Four times a day (QID) | RECTAL | Status: DC | PRN

## 2013-10-24 MED ORDER — ONDANSETRON HCL 4 MG PO TABS: 4.0000 mg | ORAL_TABLET | Freq: Four times a day (QID) | ORAL | Status: DC | PRN

## 2013-10-24 MED ORDER — ALUM & MAG HYDROXIDE-SIMETH 200-200-20 MG/5ML PO SUSP: 30.0000 mL | Freq: Four times a day (QID) | ORAL | Status: DC | PRN

## 2013-10-24 MED ORDER — CHOLECALCIFEROL 10 MCG (400 UNIT) PO TABS
400.0000 [IU] | ORAL_TABLET | Freq: Every day | ORAL | Status: DC
  Administered 2013-10-25: 400 [IU] via ORAL
  Filled 2013-10-24: qty 1

## 2013-10-24 MED ORDER — SODIUM CHLORIDE 0.9 % IV SOLN
INTRAVENOUS | Status: AC
  Administered 2013-10-24: 15:00:00 via INTRAVENOUS

## 2013-10-24 MED ORDER — OMEGA-3-ACID ETHYL ESTERS 1 G PO CAPS
1.0000 g | ORAL_CAPSULE | Freq: Every day | ORAL | Status: DC
  Administered 2013-10-25: 1 g via ORAL
  Filled 2013-10-24: qty 1

## 2013-10-24 MED ORDER — OMEGA-3 FATTY ACIDS 1000 MG PO CAPS: 1.0000 g | ORAL_CAPSULE | Freq: Every day | ORAL | Status: DC

## 2013-10-24 MED ORDER — ENOXAPARIN SODIUM 40 MG/0.4ML ~~LOC~~ SOLN
40.0000 mg | SUBCUTANEOUS | Status: DC
  Administered 2013-10-24: 40 mg via SUBCUTANEOUS
  Filled 2013-10-24 (×2): qty 0.4

## 2013-10-24 NOTE — ED Notes (Signed)
He states that, as he was sitting in a Sunday school class this morning at his church; he felt profoundly "tired and weak"; then he recalls waking up and being really sweaty".  He denied having any discomfort or pain at that time; and continues to deny any pain or discomfort now.  Monitor shows nsr with LBBB.  He states he has had known LBBB for ~5-6 years.  He denies any sign of recent illness.

## 2013-10-24 NOTE — H&P (Signed)
[facility] History and Physical  Raymond Hinton ELF:[id number] DOB: 1927/03/21 DOA: 10/24/2013  Referring physician:  Dorie Rank PCP:  Donnie Coffin   Chief Complaint:  LOC  HPI:  The patient is a 80 y.o. year-old male with history of arthritis, chronic LBBB, and prostate cancer who presents with syncope.    The patient was last at their baseline health two days ago.  At baseline, he is very healthy, lives with his wife, drives, gardens, and works full time as an Dietitian.  He states that yesterday, he worked a full day and saw about 26 patients, more than his usual.  He felt more tired than usual, but otherwise did not have any focal symptoms.  He denies recent weight loss or gain, fevers, chills, sinus congestion, sore throat, cough, SOB, chest pain, palpitations, nausea, vomiting, diarrhea, dysuria.  He may have been urinating more frequently yesterday.  He states he ate and drank normally and did not skip meals.  This morning, he felt tired, but otherwise okay.  He was at Sunday School today and had been sitting continuously for about 25 minutes when he suddenly became hot and diaphoretic.  He states his vision went dark and he awoke on the floor, hearing his wife call his name.  Per his wife, he fell unconscious to the floor and had LOC for about 3-4 minutes.  She denies shaking or jerking.  No loss of bowel or bladder.  He awoke and about 10 minutes after waking up, was back to his normal self.  There was no focal weakness, numbness, slurred speech, facial droop.  He was slow to respond initially when he woke up, but after 2 minutes, he was speaking normally.  He denies chest pain, palpitations, chest pressure, SOB.    In the ER, VSS and orthostatics mildly positive.  WBC 12.2, but otherwise labs were wnl. CXR was unremarkable.  Troponin negative, ECG with stable LBBB and stable 12mm J-point elevation V1-V3.  UA is pending.  He is being admitted for IVF and monitoring overnight.     Review of Systems:  General:  Denies fevers, chills, weight loss or gain HEENT:  Denies changes to hearing and vision, rhinorrhea, sinus congestion, sore throat CV:  Denies chest pain and palpitations, lower extremity edema.  PULM:  Denies SOB, wheezing, cough.   GI:  Denies nausea, vomiting, constipation, diarrhea.   GU:  Denies dysuria, urgency.  Possible mild increase in frequency yesterday ENDO:  Mild increased urinary frequency yesterday.  No polydipsia   HEME:  Denies hematemesis, blood in stools, melena, abnormal bruising or bleeding.  LYMPH:  Denies lymphadenopathy.   MSK:  Denies arthralgias, myalgias.   DERM:  Denies skin rash or ulcer.   NEURO:  Denies focal numbness, weakness, slurred speech, confusion, facial droop.  PSYCH:  Denies anxiety and depression.    Past Medical History  Diagnosis Date  . Prostate ca     radiation seeds  . Arthritis   . LBBB (left bundle branch block)    Past Surgical History  Procedure Laterality Date  . Appendectomy    . Radioactive seed implant    . Root canal    . Tonsillectomy    . Lasik    . Cataract extraction     Social History:  reports that he has never smoked. He does not have any smokeless tobacco history on file. He reports that he does not drink alcohol or use illicit drugs. 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    Allergies  Allergen Reactions  . Penicillins Anaphylaxis  . Codeine Other (See Comments)    Patient states that it makes him go "crazy"    Family History  Problem Relation Age of Onset  . Heart attack Mother   . Heart attack Sister   . Lung cancer Brother   . Lung cancer Sister   . Other Father     sudden cardiac 45 yo  . Colon cancer Sister      Prior to Admission medications   Medication Sig Start Date End Date Taking? Authorizing Provider  aspirin 81 MG tablet Take 81 mg by mouth daily.     Yes Historical Provider, MD  fish oil-omega-3 fatty acids 1000 MG capsule Take 1 g by mouth  daily.     Yes Historical Provider, MD  Multiple Vitamins-Minerals (ICAPS PO) Take 1 capsule by mouth daily.   Yes Historical Provider, MD  VITAMIN D, ERGOCALCIFEROL, PO Take 1 tablet by mouth daily.     Yes Historical Provider, MD  Vitamin Mixture (VITAMIN E COMPLETE PO) Take 1 tablet by mouth daily.     Yes Historical Provider, MD   Physical Exam: Filed Vitals:   10/24/13 1048 10/24/13 1110 10/24/13 1112 10/24/13 1113  BP: 130/64 117/68 108/54 106/56  Pulse: 67 66 70 76  Temp: 97.8 F (36.6 C)     TempSrc: Oral     Resp: 16 16 16 18   SpO2: 95% 95% 97% 96%     General:  Average weight CM, NAD  Eyes:  PERRL, anicteric, non-injected.  ENT:  Nares clear.  OP clear, non-erythematous without plaques or exudates.  MMM.    Neck:  Supple without TM or JVD.    Lymph:  No cervical, supraclavicular, or submandibular LAD.  Cardiovascular:  RRR, normal S1, S2, 1/6 systolic murmur at apex. No r/g.  2+ pulses, warm extremities  Respiratory:  CTA bilaterally without increased WOB.  Abdomen:  NABS.  Soft, ND/NT.    Skin:  No rashes or focal lesions.  Musculoskeletal:  Normal bulk and tone.  No LE edema.  Psychiatric:  A & O x 4.  Appropriate affect.  Neurologic:  CN 3-12 intact.  5/5 strength.  Sensation intact.  No dysmetria.    Labs on Admission:  Basic Metabolic Panel:  Recent Labs Lab 10/24/13 1100  NA 139  K 4.5  CL 103  CO2 24  GLUCOSE 99  BUN 20  CREATININE 1.11  CALCIUM 8.5   Liver Function Tests:  Recent Labs Lab 10/24/13 1100  AST 16  ALT 14  ALKPHOS 58  BILITOT 1.8*  PROT 6.2  ALBUMIN 3.2*   No results found for this basename: LIPASE, AMYLASE,  in the last 168 hours No results found for this basename: AMMONIA,  in the last 168 hours CBC:  Recent Labs Lab 10/24/13 1100  WBC 12.2*  NEUTROABS 8.6*  HGB 13.5  HCT 40.0  MCV 91.1  PLT 218   Cardiac Enzymes: No results found for this basename: CKTOTAL, CKMB, CKMBINDEX, TROPONINI,  in the last  168 hours  BNP (last 3 results) No results found for this basename: PROBNP,  in the last 8760 hours CBG: No results found for this basename: GLUCAP,  in the last 168 hours  Radiological Exams on Admission: Dg Chest Port 1 View  10/24/2013   CLINICAL DATA:  Loss of consciousness  EXAM: PORTABLE CHEST - 1 VIEW  COMPARISON:  08/30/2011  FINDINGS: Heart size and  vascular pattern are normal. No infiltrate or consolidation. 3 mm calcified granuloma lateral left upper lobe stable. Hypertrophy of the anterior and a both first ribs stable.  IMPRESSION: No active disease.   Electronically Signed   By: Skipper Cliche M.D.   On: 10/24/2013 11:16    EKG: Independently reviewed. LBBB and stable 60mm J-point elevation V1-V3   Assessment/Plan Principal Problem:   Syncope Active Problems:   Left bundle branch block   Leukocytosis  ---  Syncope.  I suspect that Mr. Meech may be coming down with a virus or other infection or may simply be mildly dehydrated.  He appears otherwise well.  No chest pain or pressure or ECG changes to suggest ischemic and presyncopal symptoms suggest against arrhythmia, although paroxysmal a-fib or other arrhythmia is possible.   -  Telemetry -  IVF -  Repeat troponin to eval for heart strain -  Repeat orthostatics in AM -  No new murmurs or ECG changes or chest pain to suggest ECHO is necessary at this time  Leukocytosis -  CXR negative -  Hyperactive BS, monitor for diarrhea -  UA  LBBB, chronic stable.    Diet:  regular Access:  PIV IVF:  yes Proph:  lovenox  Code Status: full Family Communication: patient and wife Disposition Plan: Admit to telemetry under observation.  Likely home tomorrow if feeling better, orthostatics improved, telemetry unremarkable  Time spent: 60 min Cheronda Erck [facility] Pager [phone]  If 7PM-7AM, please contact night-coverage [email or web address] Password [facility] 10/24/2013, 2:34 PM

## 2013-10-24 NOTE — ED Notes (Signed)
Bed: WA08 Expected date:  Expected time:  Means of arrival:  Comments: syncope

## 2013-10-24 NOTE — ED Provider Notes (Signed)
CSN: [id number]     Arrival date & time 10/24/13  1035 History   First MD Initiated Contact with Patient 10/24/13 1039     Chief Complaint  Patient presents with  . Loss of Consciousness    HPI Comments: The patient was at church today sitting in a class. Patient states he suddenly started to feel very tired and weak. The patient does admit to having a very strenuous day the day before at bedtime feel particularly fatigued or weak in the morning before going to church.  He suddenly murmurs feeling very weak and sweaty and then slumping down in the chair. The family witnessed this and he was unconscious for a brief period of time. Patient feels fine now it is in the emergency room he has no other complaint  Patient is a 80 y.o. male presenting with syncope. The history is provided by the patient.  Loss of Consciousness Episode history:  Single Most recent episode:  Today Progression:  Resolved Chronicity:  New Context: sitting down   Context: not blood draw, not bowel movement, not dehydration, not exertion, not medication change, not sight of blood and not urination   Witnessed: yes   Relieved by:  Nothing Worsened by:  Nothing tried Ineffective treatments:  None tried Associated symptoms: diaphoresis   Associated symptoms: no chest pain, no confusion, no difficulty breathing, no fever, no focal weakness, no headaches, no malaise/fatigue, no nausea, no palpitations, no seizures, no shortness of breath and no vomiting   Risk factors: no congenital heart disease, no coronary artery disease and no seizures     Past Medical History  Diagnosis Date  . Prostate ca    Past Surgical History  Procedure Laterality Date  . Appendectomy    . Radioactive seed implant    . Root canal    . Tonsillectomy     Family History  Problem Relation Age of Onset  . Heart attack Mother   . Heart attack Sister   . Heart disease Sister   . Cancer Brother   . Lung cancer Sister    History  Substance  Use Topics  . Smoking status: Never Smoker   . Smokeless tobacco: Not on file  . Alcohol Use: No    Review of Systems  Constitutional: Positive for diaphoresis. Negative for fever and malaise/fatigue.  Respiratory: Negative for shortness of breath.   Cardiovascular: Positive for syncope. Negative for chest pain and palpitations.  Gastrointestinal: Negative for nausea and vomiting.  Neurological: Negative for focal weakness, seizures and headaches.  Psychiatric/Behavioral: Negative for confusion.  All other systems reviewed and are negative.    Allergies  Penicillins and Codeine  Home Medications   Current Outpatient Rx  Name  Route  Sig  Dispense  Refill  . aspirin 81 MG tablet   Oral   Take 81 mg by mouth daily.           . fish oil-omega-3 fatty acids 1000 MG capsule   Oral   Take 1 g by mouth daily.           . Multiple Vitamins-Minerals (ICAPS PO)   Oral   Take 1 capsule by mouth daily.         Marland Kitchen VITAMIN D, ERGOCALCIFEROL, PO   Oral   Take 1 tablet by mouth daily.           . Vitamin Mixture (VITAMIN E COMPLETE PO)   Oral   Take 1 tablet by mouth daily.  BP 106/56  Pulse 76  Temp(Src) 97.8 F (36.6 C) (Oral)  Resp 18  SpO2 96% Physical Exam  Nursing note and vitals reviewed. Constitutional: He appears well-developed and well-nourished. No distress.  HENT:  Head: Normocephalic and atraumatic.  Right Ear: External ear normal.  Left Ear: External ear normal.  Eyes: Conjunctivae are normal. Right eye exhibits no discharge. Left eye exhibits no discharge. No scleral icterus.  Neck: Neck supple. No tracheal deviation present.  Cardiovascular: Normal rate, regular rhythm and intact distal pulses.   Pulmonary/Chest: Effort normal and breath sounds normal. No stridor. No respiratory distress. He has no wheezes. He has no rales.  Abdominal: Soft. Bowel sounds are normal. He exhibits no distension. There is no tenderness. There is no rebound  and no guarding.  Musculoskeletal: He exhibits no edema and no tenderness.  Neurological: He is alert. He has normal strength. No cranial nerve deficit (no facial droop, extraocular movements intact, no slurred speech) or sensory deficit. He exhibits normal muscle tone. He displays no seizure activity. Coordination normal.  Skin: Skin is warm and dry. No rash noted.  Psychiatric: He has a normal mood and affect.    ED Course  Procedures (including critical care time) Labs Review Labs Reviewed  CBC WITH DIFFERENTIAL - Abnormal; Notable for the following:    WBC 12.2 (*)    Neutro Abs 8.6 (*)    All other components within normal limits  COMPREHENSIVE METABOLIC PANEL - Abnormal; Notable for the following:    Albumin 3.2 (*)    Total Bilirubin 1.8 (*)    GFR calc non Af Amer 59 (*)    GFR calc Af Amer 68 (*)    All other components within normal limits  TROPONIN I  POCT I-STAT TROPONIN I  POCT CBG (FASTING - GLUCOSE)-MANUAL ENTRY   Imaging Review Dg Chest Port 1 View  10/24/2013   CLINICAL DATA:  Loss of consciousness  EXAM: PORTABLE CHEST - 1 VIEW  COMPARISON:  08/30/2011  FINDINGS: Heart size and vascular pattern are normal. No infiltrate or consolidation. 3 mm calcified granuloma lateral left upper lobe stable. Hypertrophy of the anterior and a both first ribs stable.  IMPRESSION: No active disease.   Electronically Signed   By: Skipper Cliche M.D.   On: 10/24/2013 11:16    EKG Interpretation    Date/Time:  Sunday October 24 2013 10:46:14 EST Ventricular Rate:  74 PR Interval:  205 QRS Duration: 143 QT Interval:  455 QTC Calculation: 505 R Axis:   26 Text Interpretation:  Sinus rhythm Left bundle branch block No significant change since last tracing Confirmed by Eligah Anello  MD-J, Maitland Muhlbauer ([id number]) on 10/24/2013 10:58:02 AM            MDM   1. Syncope     Patient had a syncopal episode without a significant prodromal. He is otherwise healthy but considering his age and  baseline abnormal EKG will consult with the medical service regarding admission for cardiac monitoring.    Kathalene Frames, MD 10/24/13 [phone]

## 2013-10-24 NOTE — ED Notes (Signed)
MD at bedside. 

## 2013-10-25 ENCOUNTER — Other Ambulatory Visit: Payer: Self-pay | Admitting: Physician Assistant

## 2013-10-25 DIAGNOSIS — Z789 Other specified health status: Secondary | ICD-10-CM | POA: Diagnosis not present

## 2013-10-25 DIAGNOSIS — I359 Nonrheumatic aortic valve disorder, unspecified: Secondary | ICD-10-CM | POA: Diagnosis not present

## 2013-10-25 DIAGNOSIS — Z9189 Other specified personal risk factors, not elsewhere classified: Secondary | ICD-10-CM

## 2013-10-25 DIAGNOSIS — D72829 Elevated white blood cell count, unspecified: Secondary | ICD-10-CM | POA: Diagnosis not present

## 2013-10-25 DIAGNOSIS — R7309 Other abnormal glucose: Secondary | ICD-10-CM

## 2013-10-25 DIAGNOSIS — I447 Left bundle-branch block, unspecified: Secondary | ICD-10-CM | POA: Diagnosis not present

## 2013-10-25 DIAGNOSIS — R55 Syncope and collapse: Secondary | ICD-10-CM | POA: Diagnosis not present

## 2013-10-25 LAB — CBC
HEMATOCRIT: 40.6 % (ref 39.0–52.0)
Hemoglobin: 13.4 g/dL (ref 13.0–17.0)
MCH: 30.5 pg (ref 26.0–34.0)
MCHC: 33 g/dL (ref 30.0–36.0)
MCV: 92.5 fL (ref 78.0–100.0)
Platelets: 237 10*3/uL (ref 150–400)
RBC: 4.39 MIL/uL (ref 4.22–5.81)
RDW: 12.7 % (ref 11.5–15.5)
WBC: 7 10*3/uL (ref 4.0–10.5)

## 2013-10-25 LAB — BASIC METABOLIC PANEL
BUN: 16 mg/dL (ref 6–23)
CALCIUM: 8.6 mg/dL (ref 8.4–10.5)
CO2: 27 meq/L (ref 19–32)
CREATININE: 1.13 mg/dL (ref 0.50–1.35)
Chloride: 105 mEq/L (ref 96–112)
GFR calc Af Amer: 67 mL/min — ABNORMAL LOW (ref >90)
GFR calc non Af Amer: 58 mL/min — ABNORMAL LOW (ref >90)
GLUCOSE: 108 mg/dL — AB (ref 70–99)
Potassium: 4.5 mEq/L (ref 3.7–5.3)
SODIUM: 143 meq/L (ref 137–147)

## 2013-10-25 LAB — TROPONIN I

## 2013-10-25 LAB — TSH: TSH: 1.786 u[IU]/mL (ref 0.350–4.500)

## 2013-10-25 LAB — HEMOGLOBIN A1C
Hgb A1c MFr Bld: 6.1 % — ABNORMAL HIGH (ref <5.7)
Mean Plasma Glucose: 128 mg/dL — ABNORMAL HIGH (ref <117)

## 2013-10-25 NOTE — Progress Notes (Signed)
Echocardiogram 2D Echocardiogram has been performed.  Raymond Hinton 10/25/2013, 2:10 PM

## 2013-10-25 NOTE — Discharge Summary (Signed)
Physician Discharge Summary  Kert Makosky SNK:[id number] DOB: December 10, 1926 DOA: 10/24/2013  PCP: Lupe Carney  Admit date: 10/24/2013 Discharge date: 10/25/2013  Recommendations for Outpatient Follow-up:  1.  F/u with Dr. Jens Som for possible event monitor or if syncope recurs, consider implantable loop recorder or EP consult 2.  F/u with urology for prostate cancer 3.  F/u with primary care doctor regarding mildly elevated A1c.  Counseled to eat low carbohydrate diet and continue exercising.     Discharge Diagnoses:  Principal Problem:   Syncope Active Problems:   Left bundle branch block   Leukocytosis   At risk for diabetes mellitus/borderline diabetes   Discharge Condition: stable, improved  Diet recommendation: low carbohydrate  Wt Readings from Last 3 Encounters:  10/24/13 68 kg (149 lb 14.6 oz)  07/07/13 71.668 kg (158 lb)  09/11/11 71.215 kg (157 lb)    History of present illness:  The patient is a 80 y.o. year-old male with history of arthritis, chronic LBBB, and prostate cancer who presents with syncope.  The patient was last at their baseline health two days ago. At baseline, he is very healthy, lives with his wife, drives, gardens, and works full time as an Sport and exercise psychologist. He states that yesterday, he worked a full day and saw about 26 patients, more than his usual. He felt more tired than usual, but otherwise did not have any focal symptoms. He denies recent weight loss or gain, fevers, chills, sinus congestion, sore throat, cough, SOB, chest pain, palpitations, nausea, vomiting, diarrhea, dysuria. He may have been urinating more frequently yesterday. He states he ate and drank normally and did not skip meals. This morning, he felt tired, but otherwise okay. He was at [employer] today and had been sitting continuously for about 25 minutes when he suddenly became hot and diaphoretic. He states his vision went dark and he awoke on the floor, hearing his wife call his name.  Per his wife, he fell unconscious to the floor and had LOC for about 3-4 minutes. She denies shaking or jerking. No loss of bowel or bladder. He awoke and about 10 minutes after waking up, was back to his normal self. There was no focal weakness, numbness, slurred speech, facial droop. He was slow to respond initially when he woke up, but after 2 minutes, he was speaking normally. He denies chest pain, palpitations, chest pressure, SOB.   In the ER, VSS and orthostatics mildly positive. WBC 12.2, but otherwise labs were wnl. CXR was unremarkable. Troponin negative, ECG with stable LBBB and stable 38mm J-point elevation V1-V3. UA is pending. He is being admitted for IVF and monitoring overnight.    Hospital Course:   Syncope, likely vasovagal and/or orthostatic. I suspect that Mr. Nordell may have been fending off a virus or may simply have been mildly dehydrated. He appeared otherwise well.  He did not have chest pain or pressure or ECG changes to suggest ischemia.  His troponins were negative.  Telemetry demonstrated two episodes of bradycardia with prolonged PR interval to .280 seconds and possibly one nonconducted p-wave.  These events were asymptomatic.  He was seen by cardiology who recommended ECHO which demonstrated preserved EF without mention of focal wall motion abnormalities or valvular problems.  He should follow up with Dr. Jens Som in cardiology clinic in a few weeks for event monitor.  TSH was wnl and A1c was only mildly elevated at 6.1.  His initial orthostatics were mildly positive (decrease in DBP from 68 to 56) and after  hydration, repeat orthostatics were negative/normal.    Leukocytosis was likely due to dehydration.  CXR and UA were negative.  No diarrhea.  He was given IVF and leukocytosis resolved.    LBBB, chronic stable.   Procedures:  CXR  ECHO  Consultations:  Cardiology, Dr. Johnsie Cancel  Discharge Exam: Filed Vitals:   10/25/13 1339  BP: 138/69  Pulse: 79  Temp: 97.7  F (36.5 C)  Resp: 20   Filed Vitals:   10/25/13 0537 10/25/13 0538 10/25/13 0539 10/25/13 1339  BP: 113/78 113/61 119/72 138/69  Pulse: 70 80 79 79  Temp: 98.7 F (37.1 C)   97.7 F (36.5 C)  TempSrc: Oral   Oral  Resp: 20 22 24 20   Height:      Weight:      SpO2: 98% 95% 95% 98%    General: CM, NAD Cardiovascular: RRR, 1/6 systolic murmur at the apex, no r/g.  2+ pulses Respiratory: CTAB ABD:  NABS, soft, NT/ND MSK:  Bulk and tone better than expected for age, no LEE Neuro:  CN II-XII grossly intact, strength 5/5, sensation intact to light touch  Discharge Instructions      Discharge Orders   Future Appointments Provider Department Dept Phone   11/01/2013 11:10 AM Liliane Shi, PA-C [place] Office [phone]   Future Orders Complete By Expires   Call MD for:  difficulty breathing, headache or visual disturbances  As directed    Call MD for:  extreme fatigue  As directed    Call MD for:  hives  As directed    Call MD for:  persistant dizziness or light-headedness  As directed    Call MD for:  persistant nausea and vomiting  As directed    Call MD for:  severe uncontrolled pain  As directed    Call MD for:  temperature >100.4  As directed    Diet Carb Modified  As directed    Discharge instructions  As directed    Comments:     You were hospitalized with a fainting spell which probably occurred because you were mildly dehydrated and you may have been fighting a virus.  You did not have pneumonia or urinary tract infection.  You were incidentally found to have some intermittent slow heart rate with Irja Wheless pauses/changes in the electrical activity of the heart.  You were seen by cardiology and had an ECHO done which showed that the structure and function of your heart are essentially normal.  You may be at risk of developing diabetes in the future so please try to eat complex carbohydrates instead of simple sugars, for example, brown rice instead of white  rice, and avoiding candies and sugared sodas.  Your primary care doctor may want to repeat the screening test for diabetes in about 3-6 months.  Stay active and stay hydrated!  Make sure you follow up with cardiology in a few weeks.  Please return to the hospital if you have another fainting spell.   Increase activity slowly  As directed        Medication List         aspirin 81 MG tablet  Take 81 mg by mouth daily.     fish oil-omega-3 fatty acids 1000 MG capsule  Take 1 g by mouth daily.     ICAPS PO  Take 1 capsule by mouth daily.     VITAMIN D (ERGOCALCIFEROL) PO  Take 1 tablet by mouth daily.  VITAMIN E COMPLETE PO  Take 1 tablet by mouth daily.       Follow-up Information   Follow up with Richardson Dopp, PA-C On 11/01/2013. (See for Dr. Stanford Breed at 11:10 am)    Specialty:  Physician Assistant   Contact information:   [address] [place] [address] [phone]       Follow up with Donnie Coffin. Schedule an appointment as soon as possible for a visit in 2 weeks.   Specialty:  Family Medicine   Contact information:   [address] [place] [address]        The results of significant diagnostics from this hospitalization (including imaging, microbiology, ancillary and laboratory) are listed below for reference.    Significant Diagnostic Studies: Dg Chest Port 1 View  10/24/2013   CLINICAL DATA:  Loss of consciousness  EXAM: PORTABLE CHEST - 1 VIEW  COMPARISON:  08/30/2011  FINDINGS: Heart size and vascular pattern are normal. No infiltrate or consolidation. 3 mm calcified granuloma lateral left upper lobe stable. Hypertrophy of the anterior and a both first ribs stable.  IMPRESSION: No active disease.   Electronically Signed   By: Skipper Cliche M.D.   On: 10/24/2013 11:16    Microbiology: No results found for this or any previous visit (from the past 240 hour(s)).   Labs: Basic Metabolic Panel:  Recent Labs Lab  10/24/13 1100 10/25/13 0241  NA 139 143  K 4.5 4.5  CL 103 105  CO2 24 27  GLUCOSE 99 108*  BUN 20 16  CREATININE 1.11 1.13  CALCIUM 8.5 8.6   Liver Function Tests:  Recent Labs Lab 10/24/13 1100  AST 16  ALT 14  ALKPHOS 58  BILITOT 1.8*  PROT 6.2  ALBUMIN 3.2*   No results found for this basename: LIPASE, AMYLASE,  in the last 168 hours No results found for this basename: AMMONIA,  in the last 168 hours CBC:  Recent Labs Lab 10/24/13 1100 10/25/13 0241  WBC 12.2* 7.0  NEUTROABS 8.6*  --   HGB 13.5 13.4  HCT 40.0 40.6  MCV 91.1 92.5  PLT 218 237   Cardiac Enzymes:  Recent Labs Lab 10/24/13 1634 10/24/13 2032 10/25/13 0241  TROPONINI <0.30 <0.30 <0.30   BNP: BNP (last 3 results) No results found for this basename: PROBNP,  in the last 8760 hours CBG: No results found for this basename: GLUCAP,  in the last 168 hours  Time coordinating discharge: 45 minutes  Signed:  Scarlet Abad  [facility] 10/25/2013, 3:09 PM

## 2013-10-25 NOTE — Consult Note (Signed)
CARDIOLOGY CONSULT NOTE       Patient ID: Jaelyn Cloninger MRN: [id number] DOB/AGE: 1926/10/13 80 y.o.  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  He was at [employer] yesterday  and had been sitting continuously for about 25 minutes when he suddenly became hot and diaphoretic. He states his vision went dark and he awoke on the floor, hearing his wife call his name. Per his wife, he fell unconscious to the floor and had LOC for about 3-4 minutes. She denies shaking or jerking. No loss of bowel or bladder. He awoke and about 10 minutes after waking up, was back to his normal self. There was no focal weakness, numbness, slurred speech, facial droop. He was slow to respond initially when he woke up, but after 2 minutes, he was speaking normally. He denies chest pain, palpitations, chest pressure, SOB.  Seen in 2013 Had LBBB which is chronic  Myovue was normal as was echo with just mild MR.  Review of telemetry shows SR  Had a couple of episodes of HR to 43 with no symptoms some PR interval prolonging no CHB.  Feels fine this am    ROS All other systems reviewed and negative except as noted above  Past Medical History  Diagnosis Date  . Prostate ca     radiation seeds  . Arthritis   . LBBB (left bundle branch block)     Family History  Problem Relation Age of Onset  . Heart attack Mother   . Heart attack Sister   . Lung cancer Brother   . Lung cancer Sister   . Other Father     sudden cardiac 55 yo  . Colon cancer Sister     History   Social History  . Marital Status: Married    Spouse Name: N/A    Number of Children: N/A  . Years of Education: N/A   Occupational History  . Not on file.   Social History Main Topics  .  Smoking status: Never Smoker   . Smokeless tobacco: Not on file  . Alcohol Use: No  . Drug Use: No  . Sexual Activity: Not on file   Other Topics Concern  . 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            Past Surgical History  Procedure Laterality Date  . Appendectomy    . Radioactive seed implant    . Root canal    . Tonsillectomy    . Lasik    . Cataract extraction       . aspirin  81 mg Oral Daily  . cholecalciferol  400 Units Oral Daily  . enoxaparin (LOVENOX) injection  40 mg Subcutaneous Q24H  . multivitamin with minerals  1 tablet Oral Daily  . omega-3 acid ethyl esters  1 g Oral Daily  . sodium chloride  3 mL Intravenous Q12H  . vitamin E  100 Units Oral Daily      Physical Exam: Blood pressure 119/72, pulse 79, temperature 98.7 F (37.1 C), temperature source Oral, resp. rate 24, height 5\' 9"  (1.753 m), weight 149 lb 14.6 oz (68 kg), SpO2 95.00%.  Affect appropriate Healthy:  appears stated age 80: normal Neck supple with no adenopathy JVP normal no bruits no thyromegaly Lungs clear with no wheezing and good diaphragmatic motion Heart:  S1/S2 soft MR murmur, no rub, gallop or click PMI normal Abdomen: benighn, BS positve, no tenderness, no AAA no bruit.  No HSM or HJR Distal pulses intact with no bruits No edema Neuro non-focal Skin warm and dry No muscular weakness   Labs:   Lab Results  Component Value Date   WBC 7.0 10/25/2013   HGB 13.4 10/25/2013   HCT 40.6 10/25/2013   MCV 92.5 10/25/2013   PLT 237 10/25/2013    Recent Labs Lab 10/24/13 1100 10/25/13 0241  NA 139 143  K 4.5 4.5  CL 103 105  CO2 24 27  BUN 20 16  CREATININE 1.11 1.13  CALCIUM 8.5 8.6  PROT 6.2  --   BILITOT 1.8*  --   ALKPHOS 58  --   ALT 14  --   AST 16  --   GLUCOSE 99 108*   Lab Results  Component Value Date   CKTOTAL 80 08/30/2011   CKMB 3.7 08/30/2011   TROPONINI <0.30 10/25/2013         Radiology: Dg Chest Port 1 View  10/24/2013   CLINICAL DATA:  Loss of consciousness  EXAM: PORTABLE CHEST - 1 VIEW  COMPARISON:  08/30/2011  FINDINGS: Heart size and vascular pattern are normal. No infiltrate or consolidation. 3 mm calcified granuloma lateral left upper lobe stable. Hypertrophy of the anterior and a both first ribs stable.  IMPRESSION: No active disease.   Electronically Signed   By: Skipper Cliche M.D.   On: 10/24/2013 11:16    EKG:  SR LBBB old PR 205 msec   ASSESSMENT AND PLAN:  Syncope:  Event sounds vagal.  R/O no chest pain Feels normal now and is anxious for d/c.  He clearly has some chronic conduction disease.  Pauses this am not symptomatic.  If echo normal can d/c today and will arrange outpatient f/ut with Dr Stanford Breed for possible event monitor  If he has recurrence would consider  ILR or EP consult to study HV interval.   Prostate cancer:  F/U with urology s/p seed impant  Follow PSA: BS:  A1c mildly elevated at 6.1  Discussed low carb diet f/u primary   Signed: Jenkins Rouge 10/25/2013, 11:29 AM

## 2013-10-25 NOTE — Progress Notes (Signed)
UR completed 

## 2013-11-01 ENCOUNTER — Encounter: Payer: Medicare Other | Admitting: Physician Assistant

## 2013-11-02 ENCOUNTER — Encounter: Payer: Self-pay | Admitting: *Deleted

## 2013-11-03 ENCOUNTER — Encounter: Payer: Self-pay | Admitting: Physician Assistant

## 2013-11-03 ENCOUNTER — Ambulatory Visit (INDEPENDENT_AMBULATORY_CARE_PROVIDER_SITE_OTHER): Payer: Medicare Other | Admitting: Physician Assistant

## 2013-11-03 VITALS — BP 120/70 | HR 73 | Ht 69.0 in | Wt 160.0 lb

## 2013-11-03 DIAGNOSIS — R55 Syncope and collapse: Secondary | ICD-10-CM

## 2013-11-03 NOTE — Patient Instructions (Signed)
Your physician recommends that you continue on your current medications as directed. Please refer to the Current Medication list given to you today.  Your physician has recommended that you wear an event monitor DX SYNCOPE. Event monitors are medical devices that record the heart's electrical activity. Doctors most often [place] these monitors to diagnose arrhythmias. Arrhythmias are problems with the speed or rhythm of the heartbeat. The monitor is a small, portable device. You can wear one while you do your normal daily activities. This is usually used to diagnose what is causing palpitations/syncope (passing out).  You have been referred to [facility] IN 6 WEEKS; IF NOT AVAILABLE THEN SCHEDULE A FOLLOW UP WITH DR. CRENSHAW IN  6 WEEKS

## 2013-11-03 NOTE — Progress Notes (Signed)
[place], [place], [place]  [address] Phone: [phone] Fax:  [phone]  Date:  11/03/2013   ID:  Rohil Lesch, DOB 01-07-1927, MRN [id number]  PCP:  Donnie Coffin  Cardiologist:  Dr. Kirk Ruths     History of Present Illness: Raymond Hinton is a 80 y.o. male with a hx of LBBB, prostate CA.  Seen by Dr. Kirk Ruths in 2012 for chest pain.  Adenosine Myoview (09/2011):  No ischemia, EF 68%, low risk.  Echo (09/2011):  EF 55-60%, mild MR, mild AI, mild LAE, PASP 35.    He was admitted to the hospital 1/18-1/19 with an episode of syncope. This occurred after sitting for about 25 minutes. He suddenly became hot and diaphoretic. He lost consciousness for about 3 or 4 minutes. There was no loss of bowel or bladder function or shaking noted. He was seen by Dr. Johnsie Cancel in the hospital.  It was felt that his syncope sounded vagal in nature. Echocardiogram was performed.  Echo (10/25/2013): Mild LVH, EF 55-60%, normal wall motion, mild AI, mild MR, PASP 43.  Telemetry demonstrated sinus rhythm with episodes of heart rates down to 43 there were asymptomatic. He did have some prolonged PR intervals as well.  Outpatient follow up was recommended.  Since d/c, he is doing well.  The patient denies chest pain, shortness of breath, orthopnea, PND or significant pedal edema. No further syncope.  No near syncope.   Recent Labs: 10/24/2013: ALT 14; TSH 1.786  10/25/2013: Creatinine 1.13; Hemoglobin 13.4; Potassium 4.5   Wt Readings from Last 3 Encounters:  11/03/13 160 lb (72.576 kg)  10/24/13 149 lb 14.6 oz (68 kg)  07/07/13 158 lb (71.668 kg)     Past Medical History  Diagnosis Date  . Prostate ca     radiation seeds  . Arthritis   . LBBB (left bundle branch block)   . Hx of echocardiogram     a. Echo (09/2011):  EF 55-60%, mild MR, mild AI, mild LAE, PASP 35;  b. Echo (10/25/2013): Mild LVH, EF 55-60%, normal wall motion, mild AI, mild MR, PASP 43  . Hx of cardiovascular  stress test     Adenosine Myoview (09/2011):  No ischemia, EF 68%, low risk.    Current Outpatient Prescriptions  Medication Sig Dispense Refill  . aspirin 81 MG tablet Take 81 mg by mouth daily.        . fish oil-omega-3 fatty acids 1000 MG capsule Take 1 g by mouth daily.        . Multiple Vitamins-Minerals (ICAPS PO) Take 1 capsule by mouth daily.      Marland Kitchen VITAMIN D, ERGOCALCIFEROL, PO Take 1 tablet by mouth daily.        . Vitamin Mixture (VITAMIN E COMPLETE PO) Take 1 tablet by mouth daily.         No current facility-administered medications for this visit.    Allergies:   Penicillins and Codeine   Social History:  The patient  reports that he has never smoked. He does not have any smokeless tobacco history on file. He reports that he does not drink alcohol or use illicit drugs.   Family History:  The patient's family history includes Colon cancer in his sister; Heart attack in his mother and sister; Lung cancer in his brother and sister; Other in his father.   ROS:  Please see the history of present illness.      All other systems reviewed and  negative.   PHYSICAL EXAM: VS:  BP 120/70  Pulse 73  Ht 5\' 9"  (1.753 m)  Wt 160 lb (72.576 kg)  BMI 23.62 kg/m2 Well nourished, well developed, in no acute distress HEENT: normal Neck: no JVD Vascular:  No carotid bruits Cardiac:  normal S1, S2; RRR; no murmur Lungs:  clear to auscultation bilaterally, no wheezing, rhonchi or rales Abd: soft, nontender, no hepatomegaly Ext: no edema Skin: warm and dry Neuro:  CNs 2-12 intact, no focal abnormalities noted  EKG:  NSR, HR 73, LBBB     ASSESSMENT AND PLAN:  1. Syncope:  No recurrence.  EF was normal on Echo.  I reviewed with Dr. Jenkins Rouge who saw him in the hospital.  He did not see anything in the hospital on tele that would make [place] restrict his driving.  I will arrange an event monitor to further assess for arrhythmias.   2. Disposition:  F/u with Dr. Kirk Ruths in 6  weeks.  He may need to be referred to EP (defer to Dr. Stanford Breed).  He may need an ILR if his monitor is ok and he has further episodes of syncope.   Signed, Richardson Dopp, PA-C  11/03/2013 11:24 AM

## 2013-11-04 ENCOUNTER — Encounter (INDEPENDENT_AMBULATORY_CARE_PROVIDER_SITE_OTHER): Payer: Medicare Other

## 2013-11-04 ENCOUNTER — Encounter: Payer: Self-pay | Admitting: *Deleted

## 2013-11-04 DIAGNOSIS — R55 Syncope and collapse: Secondary | ICD-10-CM | POA: Diagnosis not present

## 2013-11-04 NOTE — Progress Notes (Signed)
Patient ID: Raymond Hinton, male   DOB: [birth year], 80 y.o.   MRN: [id number] E-Cardio verite 30 day cardiac event monitor applied to patient.

## 2013-11-10 DIAGNOSIS — J069 Acute upper respiratory infection, unspecified: Secondary | ICD-10-CM | POA: Diagnosis not present

## 2013-12-09 ENCOUNTER — Telehealth: Payer: Self-pay | Admitting: *Deleted

## 2013-12-09 NOTE — Telephone Encounter (Signed)
Spoke with pt, aware monitor reviewed by dr Stanford Breed shows sinus

## 2013-12-22 ENCOUNTER — Ambulatory Visit (INDEPENDENT_AMBULATORY_CARE_PROVIDER_SITE_OTHER): Payer: Medicare Other | Admitting: Cardiology

## 2013-12-22 ENCOUNTER — Encounter: Payer: Self-pay | Admitting: Cardiology

## 2013-12-22 VITALS — BP 110/62 | HR 66 | Ht 69.0 in | Wt 160.4 lb

## 2013-12-22 DIAGNOSIS — R55 Syncope and collapse: Secondary | ICD-10-CM | POA: Diagnosis not present

## 2013-12-22 NOTE — Patient Instructions (Signed)
Your physician recommends that you continue on your current medications as directed. Please refer to the Current Medication list given to you today.  Your physician wants you to follow-up in: 6 months with Dr. Stanford Breed. You will receive a reminder letter in the mail two months in advance. If you don't receive a letter, please call our office to schedule the follow-up appointment.

## 2013-12-22 NOTE — Progress Notes (Signed)
HPI: FU syncope; hx of LBBB. Adenosine Myoview (09/2011): No ischemia, EF 68%, low risk. Echo (09/2011): EF 55-60%, mild MR, mild AI, mild LAE, PASP 35.  He was admitted to the hospital 1/15 with an episode of syncope. This occurred after sitting for about 25 minutes. He suddenly became hot and diaphoretic. He lost consciousness for about 3 or 4 minutes. There was no loss of bowel or bladder function or shaking noted. He was seen by Dr. Johnsie Cancel in the hospital. It was felt that his syncope sounded vagal in nature. Echocardiogram was performed. Echo (10/25/2013): Mild LVH, EF 55-60%, normal wall motion, mild AI, mild MR, PASP 43. Telemetry demonstrated sinus rhythm with episodes of heart rates down to 43 there were asymptomatic. He did have some prolonged PR intervals as well. Event monitor Jan 2015 showed sinus. Since he was last seen, there is no dyspnea, chest pain, palpitations or syncope.   Current Outpatient Prescriptions  Medication Sig Dispense Refill  . aspirin 81 MG tablet Take 81 mg by mouth daily.        . fish oil-omega-3 fatty acids 1000 MG capsule Take 1 g by mouth daily.        . Multiple Vitamins-Minerals (ICAPS PO) Take 1 capsule by mouth daily.      Marland Kitchen VITAMIN D, ERGOCALCIFEROL, PO Take 1 tablet by mouth daily.        . Vitamin Mixture (VITAMIN E COMPLETE PO) Take 1 tablet by mouth daily.         No current facility-administered medications for this visit.     Past Medical History  Diagnosis Date  . Prostate ca     radiation seeds  . Arthritis   . LBBB (left bundle branch block)   . Hx of echocardiogram     a. Echo (09/2011):  EF 55-60%, mild MR, mild AI, mild LAE, PASP 35;  b. Echo (10/25/2013): Mild LVH, EF 55-60%, normal wall motion, mild AI, mild MR, PASP 43  . Hx of cardiovascular stress test     Adenosine Myoview (09/2011):  No ischemia, EF 68%, low risk.    Past Surgical History  Procedure Laterality Date  . Appendectomy    . Radioactive seed implant      . Root canal    . Tonsillectomy    . Lasik    . Cataract extraction      History   Social History  . Marital Status: Married    Spouse Name: N/A    Number of Children: N/A  . Years of Education: N/A   Occupational History  . Not on file.   Social History Main Topics  . Smoking status: Never Smoker   . Smokeless tobacco: Not on file  . Alcohol Use: No  . Drug Use: No  . Sexual Activity: Not on file   Other Topics Concern  . 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            ROS: arthralgias but no fevers or chills, productive cough, hemoptysis, dysphasia, odynophagia, melena, hematochezia, dysuria, hematuria, rash, seizure activity, orthopnea, PND, pedal edema, claudication. Remaining systems are negative.  Physical Exam: Well-developed well-nourished in no acute distress.  Skin is warm and dry.  HEENT is normal.  Neck is supple.  Chest is clear to auscultation with normal expansion.  Cardiovascular exam is regular rate and rhythm.  Abdominal exam nontender or distended. No  masses palpated. Extremities show no edema. neuro grossly intact

## 2013-12-22 NOTE — Assessment & Plan Note (Signed)
No recurrent episodes. I am concerned about the possibility of bradycardia mediated given baseline conduction abnormalities. However his monitor was unremarkable. We will follow expectantly. If he has recurrent events he may require a pacemaker versus implantable loop recorder. I have instructed him not to drive for 6 months following his event.

## 2014-05-10 DIAGNOSIS — L819 Disorder of pigmentation, unspecified: Secondary | ICD-10-CM | POA: Diagnosis not present

## 2014-05-10 DIAGNOSIS — D485 Neoplasm of uncertain behavior of skin: Secondary | ICD-10-CM | POA: Diagnosis not present

## 2014-05-10 DIAGNOSIS — D235 Other benign neoplasm of skin of trunk: Secondary | ICD-10-CM | POA: Diagnosis not present

## 2014-05-23 DIAGNOSIS — Z8546 Personal history of malignant neoplasm of prostate: Secondary | ICD-10-CM | POA: Diagnosis not present

## 2014-05-23 DIAGNOSIS — Z880 Allergy status to penicillin: Secondary | ICD-10-CM | POA: Diagnosis not present

## 2014-05-23 DIAGNOSIS — C61 Malignant neoplasm of prostate: Secondary | ICD-10-CM | POA: Diagnosis not present

## 2014-05-23 DIAGNOSIS — Z885 Allergy status to narcotic agent status: Secondary | ICD-10-CM | POA: Diagnosis not present

## 2014-05-24 DIAGNOSIS — Z961 Presence of intraocular lens: Secondary | ICD-10-CM | POA: Diagnosis not present

## 2014-06-17 DIAGNOSIS — H21309 Idiopathic cysts of iris, ciliary body or anterior chamber, unspecified eye: Secondary | ICD-10-CM | POA: Diagnosis not present

## 2014-06-17 DIAGNOSIS — Z961 Presence of intraocular lens: Secondary | ICD-10-CM | POA: Diagnosis not present

## 2014-06-21 ENCOUNTER — Ambulatory Visit (INDEPENDENT_AMBULATORY_CARE_PROVIDER_SITE_OTHER): Payer: Medicare Other | Admitting: Cardiology

## 2014-06-21 ENCOUNTER — Encounter: Payer: Self-pay | Admitting: Cardiology

## 2014-06-21 VITALS — BP 112/60 | HR 57 | Ht 69.5 in | Wt 151.9 lb

## 2014-06-21 DIAGNOSIS — R0789 Other chest pain: Secondary | ICD-10-CM

## 2014-06-21 NOTE — Assessment & Plan Note (Signed)
No recurrent episodes.Etiology of previous events unclear. Hopefully he will have no further episodes. If he does we will consider an implantable loop.

## 2014-06-21 NOTE — Progress Notes (Signed)
      HPI: FU syncope. Seen in 2012 for chest pain. Adenosine Myoview (09/2011): No ischemia, EF 68%, low risk. Echo (09/2011): EF 55-60%, mild MR, mild AI, mild LAE, PASP 35.  He was admitted to the hospital 1/15 with an episode of syncope. Echo (10/25/2013): Mild LVH, EF 55-60%, normal wall motion, mild AI, mild MR, PASP 43. Telemetry demonstrated sinus rhythm with episodes of heart rates down to 43 that were asymptomatic. Event monitor 1/15 showed sinus rhythm. Since last seen, the patient denies any dyspnea on exertion, orthopnea, PND, pedal edema, palpitations, syncope or chest pain.    Current Outpatient Prescriptions  Medication Sig Dispense Refill  . aspirin 81 MG tablet Take 81 mg by mouth daily.        . fish oil-omega-3 fatty acids 1000 MG capsule Take 1 g by mouth daily.        . Multiple Vitamins-Minerals (ICAPS PO) Take 1 capsule by mouth daily.      Marland Kitchen VITAMIN D, ERGOCALCIFEROL, PO Take 1 tablet by mouth daily.        . Vitamin Mixture (VITAMIN E COMPLETE PO) Take 1 tablet by mouth daily.         No current facility-administered medications for this visit.     Past Medical History  Diagnosis Date  . Prostate ca     radiation seeds  . Arthritis   . LBBB (left bundle branch block)   . Hx of echocardiogram     a. Echo (09/2011):  EF 55-60%, mild MR, mild AI, mild LAE, PASP 35;  b. Echo (10/25/2013): Mild LVH, EF 55-60%, normal wall motion, mild AI, mild MR, PASP 43  . Hx of cardiovascular stress test     Adenosine Myoview (09/2011):  No ischemia, EF 68%, low risk.    Past Surgical History  Procedure Laterality Date  . Appendectomy    . Radioactive seed implant    . Root canal    . Tonsillectomy    . Lasik    . Cataract extraction      History   Social History  . Marital Status: Married    Spouse Name: N/A    Number of Children: N/A  . Years of Education: N/A   Occupational History  . Not on file.   Social History Main Topics  . Smoking status: Never  Smoker   . Smokeless tobacco: Not on file  . Alcohol Use: No  . Drug Use: No  . Sexual Activity: Not on file   Other Topics Concern  . 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            ROS: no fevers or chills, productive cough, hemoptysis, dysphasia, odynophagia, melena, hematochezia, dysuria, hematuria, rash, seizure activity, orthopnea, PND, pedal edema, claudication. Remaining systems are negative.  Physical Exam: Well-developed well-nourished in no acute distress.  Skin is warm and dry.  HEENT is normal.  Neck is supple.  Chest is clear to auscultation with normal expansion.  Cardiovascular exam is regular rate and rhythm.  Abdominal exam nontender or distended. No masses palpated. Extremities show no edema. neuro grossly intact  ECG Sinus rhythm, first degree AV block, left bundle branch block.

## 2014-06-21 NOTE — Patient Instructions (Signed)
Your physician wants you to follow-up in: ONE YEAR WITH DR CRENSHAW You will receive a reminder letter in the mail two months in advance. If you don't receive a letter, please call our office to schedule the follow-up appointment.  

## 2014-06-29 DIAGNOSIS — Z961 Presence of intraocular lens: Secondary | ICD-10-CM | POA: Diagnosis not present

## 2014-06-29 DIAGNOSIS — H21309 Idiopathic cysts of iris, ciliary body or anterior chamber, unspecified eye: Secondary | ICD-10-CM | POA: Diagnosis not present

## 2014-07-22 ENCOUNTER — Other Ambulatory Visit: Payer: Self-pay

## 2014-08-12 ENCOUNTER — Emergency Department (HOSPITAL_COMMUNITY)
Admission: EM | Admit: 2014-08-12 | Discharge: 2014-08-12 | Disposition: A | Discharge status: Home / Self Care | Payer: Medicare Other | Attending: Emergency Medicine | Admitting: Emergency Medicine

## 2014-08-12 ENCOUNTER — Encounter (HOSPITAL_COMMUNITY): Payer: Self-pay | Admitting: *Deleted

## 2014-08-12 DIAGNOSIS — Z79899 Other long term (current) drug therapy: Secondary | ICD-10-CM | POA: Diagnosis not present

## 2014-08-12 DIAGNOSIS — M1990 Unspecified osteoarthritis, unspecified site: Secondary | ICD-10-CM | POA: Insufficient documentation

## 2014-08-12 DIAGNOSIS — Y9289 Other specified places as the place of occurrence of the external cause: Secondary | ICD-10-CM | POA: Diagnosis not present

## 2014-08-12 DIAGNOSIS — Z7982 Long term (current) use of aspirin: Secondary | ICD-10-CM | POA: Diagnosis not present

## 2014-08-12 DIAGNOSIS — M79604 Pain in right leg: Secondary | ICD-10-CM | POA: Diagnosis not present

## 2014-08-12 DIAGNOSIS — Z880 Allergy status to penicillin: Secondary | ICD-10-CM | POA: Diagnosis not present

## 2014-08-12 DIAGNOSIS — W2209XA Striking against other stationary object, initial encounter: Secondary | ICD-10-CM | POA: Insufficient documentation

## 2014-08-12 DIAGNOSIS — Y9389 Activity, other specified: Secondary | ICD-10-CM | POA: Insufficient documentation

## 2014-08-12 DIAGNOSIS — Z8546 Personal history of malignant neoplasm of prostate: Secondary | ICD-10-CM | POA: Diagnosis not present

## 2014-08-12 DIAGNOSIS — S9031XA Contusion of right foot, initial encounter: Secondary | ICD-10-CM | POA: Diagnosis not present

## 2014-08-12 DIAGNOSIS — I2510 Atherosclerotic heart disease of native coronary artery without angina pectoris: Secondary | ICD-10-CM | POA: Insufficient documentation

## 2014-08-12 DIAGNOSIS — T148XXA Other injury of unspecified body region, initial encounter: Secondary | ICD-10-CM

## 2014-08-12 DIAGNOSIS — S8991XA Unspecified injury of right lower leg, initial encounter: Secondary | ICD-10-CM | POA: Diagnosis present

## 2014-08-12 HISTORY — DX: Atherosclerotic heart disease of native coronary artery without angina pectoris: I25.10

## 2014-08-12 NOTE — Consult Note (Signed)
Reason for Consult:  Right leg pain Referring Physician:  Dr. Leonard Schwartz Raymond Hinton is an 80 y.o. male.  HPI:  80 y/o male without significant PMH got "caught up" in his tractor 8 days ago and says that it ran over his foot.  He says that it was quite painful but that he was able to bear weight and has been walking and standing and doing his regular activities since the injury.  He c/o swelling and soreness in the calf.  He was seen in clinic this afternoon by Dr. Rolena Infante and was sent to the ED.  He denies any h/o smoking, etoh and diabetes.  He c/o soreness in the calf that is worse with standing and walking and better with rest.  No h/o surgery or injury to the right LE.  He's accompanied by his wife and daughter.  He takes no blood thinners.  Past Medical History  Diagnosis Date  . Prostate ca     radiation seeds  . Arthritis   . LBBB (left bundle branch block)   . Hx of echocardiogram     a. Echo (09/2011):  EF 55-60%, mild MR, mild AI, mild LAE, PASP 35;  b. Echo (10/25/2013): Mild LVH, EF 55-60%, normal wall motion, mild AI, mild MR, PASP 43  . Hx of cardiovascular stress test     Adenosine Myoview (09/2011):  No ischemia, EF 68%, low risk.  . Coronary artery disease     Past Surgical History  Procedure Laterality Date  . Appendectomy    . Radioactive seed implant    . Root canal    . Tonsillectomy    . Lasik    . Cataract extraction      Family History  Problem Relation Age of Onset  . Heart attack Mother   . Heart attack Sister   . Lung cancer Brother   . Lung cancer Sister   . Other Father     sudden cardiac 89 yo  . Colon cancer Sister     Social History:  reports that he has never smoked. He does not have any smokeless tobacco history on file. He reports that he does not drink alcohol or use illicit drugs.  Allergies:  Allergies  Allergen Reactions  . Penicillins   . Codeine Other (See Comments)    Patient states that it makes him go "crazy"    Medications:  I have reviewed the patient's current medications.  ROS:  No recent f/c/n/v/wt loss.  No CP or SOB. PE:  Blood pressure 161/69, pulse 72, temperature 97.7 F (36.5 C), temperature source Oral, resp. rate 17, height 5\' 9"  (1.753 m), weight 68.947 kg (152 lb), SpO2 97 %. wn wd male in nad.  A and O x 4.  Mood and affect normal.  Eomi.  Resp unlabored.R LE without gross deformity.  Minimal swelling at the foot, though there is dorsal ecchymosis at the lesser toes.  NTTP at the toes.  Swelling around the ankle is slight.  1+ dp and pt pulses.  Feels LT dorsally and plantarly at the foot.  5/5 strength in PF and DF of the ankle and toes.  No lymphadenopathy.  There is an IPK under the 5th MT head.  He's TTP at the calf but no swelling.  No pain with passive stretch of the toe flexors and extensors and ankle plantar flexors and dorsiflexors.  Xrays:  Plain films from the office today show moderate degenerative changes at the R hallux MPJ.  No fracture, dislocation or malalignment evident.    Assessment/Plan: R leg contusion - I explained the nature of the injury to the patient and his family.  He can bear weight as tolerated and follow up with me ain 1-2 weeks.  He can massage the calf and take alleve or motrin as needed for pain.  He should elevate as needed for the swelling.  They verbalize understanding and agreement with the plan.  Raymond Hinton 08/12/2014, 8:13 PM

## 2014-08-12 NOTE — ED Notes (Signed)
Patient discharged.  Instructed to call MD Monday morning to get an appointment.  Voiced understanding

## 2014-08-12 NOTE — ED Notes (Signed)
The pt is a pt of dr brooks.  The pt was sent here with rt leg and was told by dr brooks that he had compartment syndrome.  He was in an accident and now the rt leg has turned black according to the pt.

## 2014-08-12 NOTE — ED Provider Notes (Signed)
CSN: [id number]     Arrival date & time 08/12/14  1622 History   First MD Initiated Contact with Patient 08/12/14 1857     Chief Complaint  Patient presents with  . Leg Pain      HPI  Expand All Collapse All   The pt is a pt of dr brooks.  He was in an accident 8 days ago and now the rt leg has turned black according to the pt.  Patient has had no increased pain.  The name of auditory since the accident.      Past Medical History  Diagnosis Date  . Prostate ca     radiation seeds  . Arthritis   . LBBB (left bundle branch block)   . Hx of echocardiogram     a. Echo (09/2011):  EF 55-60%, mild MR, mild AI, mild LAE, PASP 35;  b. Echo (10/25/2013): Mild LVH, EF 55-60%, normal wall motion, mild AI, mild MR, PASP 43  . Hx of cardiovascular stress test     Adenosine Myoview (09/2011):  No ischemia, EF 68%, low risk.  . Coronary artery disease    Past Surgical History  Procedure Laterality Date  . Appendectomy    . Radioactive seed implant    . Root canal    . Tonsillectomy    . Lasik    . Cataract extraction     Family History  Problem Relation Age of Onset  . Heart attack Mother   . Heart attack Sister   . Lung cancer Brother   . Lung cancer Sister   . Other Father     sudden cardiac 48 yo  . Colon cancer Sister    History  Substance Use Topics  . Smoking status: Never Smoker   . Smokeless tobacco: Not on file  . Alcohol Use: No    Review of Systems  All other systems reviewed and are negative  Allergies  Penicillins and Codeine  Home Medications   Prior to Admission medications   Medication Sig Start Date End Date Taking? Authorizing Provider  aspirin 81 MG tablet Take 81 mg by mouth daily.     Yes Historical Provider, MD  fish oil-omega-3 fatty acids 1000 MG capsule Take 1 g by mouth daily.     Yes Historical Provider, MD  ibuprofen (ADVIL,MOTRIN) 200 MG tablet Take 200 mg by mouth every 6 (six) hours as needed.   Yes Historical Provider, MD  Multiple  Vitamins-Minerals (ICAPS PO) Take 1 capsule by mouth daily.   Yes Historical Provider, MD  VITAMIN D, ERGOCALCIFEROL, PO Take 1 tablet by mouth daily.     Yes Historical Provider, MD  Vitamin Mixture (VITAMIN E COMPLETE PO) Take 1 tablet by mouth daily.     Yes Historical Provider, MD   BP 161/69 mmHg  Pulse 72  Temp(Src) 97.7 F (36.5 C) (Oral)  Resp 17  Ht 5\' 9"  (1.753 m)  Wt 152 lb (68.947 kg)  BMI 22.44 kg/m2  SpO2 97% Physical Exam Physical Exam  Nursing note and vitals reviewed. Constitutional: He is oriented to person, place, and time. He appears well-developed and well-nourished. No distress.  HENT:  Head: Normocephalic and atraumatic.  Eyes: Pupils are equal, round, and reactive to light.  Neck: Normal range of motion.  Cardiovascular: Normal rate and intact distal pulses.   Pulmonary/Chest: No respiratory distress.  Abdominal: Normal appearance. He exhibits no distension.  Musculoskeletal: Normal range of motion. patient has dependent hematoma mostly in the right  lower foot and toe area.  Calf is soft with no evidence of compartment syndrome.  Patient has good pulses and normal neurovascular exam.  Patient has no bony tenderness. Neurological: He is alert and oriented to person, place, and time. No cranial nerve deficit.  Skin: Skin is warm and dry. No rash noted.    ED Course  Procedures (including critical care time) Labs Review Labs Reviewed - No data to display  Imaging Review Imaging was reviewed by Dr. Doran Durand from the office.  No bony abnormalities.  Dr. Doran Durand saw the patient in the emergency room patient was discharged.  Patient ambulatory with no limp.  MDM   Final diagnoses:  Contusion       Dot Lanes, MD 08/12/14 2035

## 2014-08-12 NOTE — Discharge Instructions (Signed)
Contusion °A contusion is a deep bruise. Contusions happen when an injury causes bleeding under the skin. Signs of bruising include pain, puffiness (swelling), and discolored skin. The contusion may turn blue, purple, or yellow. °HOME CARE  °· Put ice on the injured area. °¨ Put ice in a plastic bag. °¨ Place a towel between your skin and the bag. °¨ Leave the ice on for 15-20 minutes, 03-04 times a day. °· Only take medicine as told by your doctor. °· Rest the injured area. °· If possible, raise (elevate) the injured area to lessen puffiness. °GET HELP RIGHT AWAY IF:  °· You have more bruising or puffiness. °· You have pain that is getting worse. °· Your puffiness or pain is not helped by medicine. °MAKE SURE YOU:  °· Understand these instructions. °· Will watch your condition. °· Will get help right away if you are not doing well or get worse. °Document Released: 03/11/2008 Document Revised: 12/16/2011 Document Reviewed: 07/29/2011 °ExitCare® Patient Information ©2015 [employer]. This information is not intended to replace advice given to you by your health care provider. Make sure you discuss any questions you have with your health care provider. ° °

## 2014-08-22 DIAGNOSIS — S86811D Strain of other muscle(s) and tendon(s) at lower leg level, right leg, subsequent encounter: Secondary | ICD-10-CM | POA: Diagnosis not present

## 2014-08-29 DIAGNOSIS — S8011XD Contusion of right lower leg, subsequent encounter: Secondary | ICD-10-CM | POA: Diagnosis not present

## 2014-08-31 DIAGNOSIS — S8011XD Contusion of right lower leg, subsequent encounter: Secondary | ICD-10-CM | POA: Diagnosis not present

## 2014-09-06 DIAGNOSIS — S8011XD Contusion of right lower leg, subsequent encounter: Secondary | ICD-10-CM | POA: Diagnosis not present

## 2014-09-09 DIAGNOSIS — S8011XD Contusion of right lower leg, subsequent encounter: Secondary | ICD-10-CM | POA: Diagnosis not present

## 2014-09-13 DIAGNOSIS — S8011XD Contusion of right lower leg, subsequent encounter: Secondary | ICD-10-CM | POA: Diagnosis not present

## 2014-09-15 DIAGNOSIS — M150 Primary generalized (osteo)arthritis: Secondary | ICD-10-CM | POA: Diagnosis not present

## 2014-09-15 DIAGNOSIS — S8011XD Contusion of right lower leg, subsequent encounter: Secondary | ICD-10-CM | POA: Diagnosis not present

## 2014-09-15 DIAGNOSIS — Z23 Encounter for immunization: Secondary | ICD-10-CM | POA: Diagnosis not present

## 2014-09-15 DIAGNOSIS — R413 Other amnesia: Secondary | ICD-10-CM | POA: Diagnosis not present

## 2014-09-20 DIAGNOSIS — S8011XD Contusion of right lower leg, subsequent encounter: Secondary | ICD-10-CM | POA: Diagnosis not present

## 2014-10-19 DIAGNOSIS — R413 Other amnesia: Secondary | ICD-10-CM | POA: Diagnosis not present

## 2014-11-23 DIAGNOSIS — Z961 Presence of intraocular lens: Secondary | ICD-10-CM | POA: Diagnosis not present

## 2014-11-23 DIAGNOSIS — H21301 Idiopathic cysts of iris, ciliary body or anterior chamber, right eye: Secondary | ICD-10-CM | POA: Diagnosis not present

## 2015-01-04 ENCOUNTER — Telehealth: Payer: Self-pay | Admitting: Cardiology

## 2015-01-04 NOTE — Telephone Encounter (Signed)
Spoke with pt wife, while at his brothers funeral he suddenly got hot, sweaty and passed out. The patient said everything was going white and blacked out, his color was gray. When EMS checked him his bp was low but eventually came back up. The patient slept the rest of the day. He had been c/o hip pain prior to passing out and had sat down and was going out to get air when he passed out. They were in [place] and were told to see [place] as soon as they got back in town. The patient will see the flex pa tomorrow at 2 pm. Pt wife voiced understanding of appt time and location

## 2015-01-04 NOTE — Telephone Encounter (Signed)
Pt's wife called in stating that yesterday while at his brother's funeral, he had a syncope episode to where he was passed out for about 5 mins. The EMS was called and they suggested that he follow up with his cardiologist as soon as he got home. Please call back  Thanks

## 2015-01-05 ENCOUNTER — Ambulatory Visit (INDEPENDENT_AMBULATORY_CARE_PROVIDER_SITE_OTHER): Payer: Medicare Other | Admitting: Physician Assistant

## 2015-01-05 ENCOUNTER — Encounter: Payer: Self-pay | Admitting: Physician Assistant

## 2015-01-05 VITALS — BP 140/67 | HR 69 | Ht 69.0 in | Wt 161.0 lb

## 2015-01-05 DIAGNOSIS — R55 Syncope and collapse: Secondary | ICD-10-CM | POA: Diagnosis not present

## 2015-01-05 DIAGNOSIS — I447 Left bundle-branch block, unspecified: Secondary | ICD-10-CM | POA: Diagnosis not present

## 2015-01-05 NOTE — Progress Notes (Signed)
Patient ID: Raymond Low, MD, male   DOB: Dec 21, 1926, 81 y.o.   MRN: [id number]    Date:  01/05/2015   ID:  Raymond Low, MD, DOB [birth year], MRN [id number]  PCP:  Donnie Coffin  Primary Cardiologist:  Stanford Breed  Chief Complaint  Patient presents with  . Loss of Consciousness  . 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  Adenosine Myoview (09/2011): No ischemia, EF 68%, Hinton risk. Echo (09/2011): EF 55-60%, mild MR, mild AI, mild LAE, PASP 35.   He was admitted to the hospital 1/15 with an episode of syncope. This occurred after sitting for about 25 minutes. He suddenly became hot and diaphoretic. He lost consciousness for about 3 or 4 minutes. There was no loss of bowel or bladder function or shaking noted. He was seen by Dr. Johnsie Cancel in the hospital. It was felt that his syncope sounded vagal in nature. Echocardiogram was performed. Echo (10/25/2013): Mild LVH, EF 55-60%, normal wall motion, mild AI, mild MR, PASP 43. Telemetry demonstrated sinus rhythm with episodes of heart rates down to 43 there were asymptomatic. He did have some prolonged PR intervals as well. Event monitor Jan 2015 showed sinus. Since he was last seen, there is no dyspnea, chest pain, palpitations or syncope.  She presents today after having another episode of some syncope well in [place] at his brother's funeral.  This past Tuesday the patient was at his brother's funeral when he had a syncopal episode. He had been complaining of feeling tired and fatigued earlier that morning and while at funeral in the church, he had been standing for more than an hour for lots of people there and felt kind of warm. He then had a syncopal episode and was caught by 2 gentleman next to him.  EMS arrived and EKG showed sinus bradycardia first degree AV block and left bundle branch block which is chronic his heart rate on today's EKG is 69 and was similar on his previous office  visit.  He is also reporting some left lateral thigh pain over the IT band which is been ongoing since Friday of last week. He's been trying ibuprofen.  Which is not helping very much.  The patient currently denies nausea, vomiting, fever, chest pain, shortness of breath, orthopnea,  PND, cough, congestion, abdominal pain, hematochezia, melena, lower extremity edema, claudication.  Wt Readings from Last 3 Encounters:  01/05/15 161 lb (73.029 kg)  08/12/14 152 lb (68.947 kg)  06/21/14 151 lb 14.4 oz (68.901 kg)     Past Medical History  Diagnosis Date  . Prostate ca     radiation seeds  . Arthritis   . LBBB (left bundle branch block)   . Hx of echocardiogram     a. Echo (09/2011):  EF 55-60%, mild MR, mild AI, mild LAE, PASP 35;  b. Echo (10/25/2013): Mild LVH, EF 55-60%, normal wall motion, mild AI, mild MR, PASP 43  . Hx of cardiovascular stress test     Adenosine Myoview (09/2011):  No ischemia, EF 68%, Hinton risk.  . Coronary artery disease     Current Outpatient Prescriptions  Medication Sig Dispense Refill  . aspirin 81 MG tablet Take 81 mg by mouth daily.      Marland Kitchen donepezil (ARICEPT) 10 MG tablet Take 10 mg by mouth at bedtime.   1  . fish oil-omega-3 fatty acids 1000 MG capsule Take 1 g  by mouth daily.      Marland Kitchen ibuprofen (ADVIL,MOTRIN) 200 MG tablet Take 200 mg by mouth every 6 (six) hours as needed.    . Multiple Vitamins-Minerals (ICAPS PO) Take 1 capsule by mouth daily.    Marland Kitchen VITAMIN D, ERGOCALCIFEROL, PO Take 1 tablet by mouth daily.      . Vitamin Mixture (VITAMIN E COMPLETE PO) Take 1 tablet by mouth daily.       No current facility-administered medications for this visit.    Allergies:    Allergies  Allergen Reactions  . Penicillins   . Codeine Other (See Comments)    Patient states that it makes him go "crazy"    Social History:  The patient  reports that he has never smoked. He does not have any smokeless tobacco history on file. He reports that he does not drink  alcohol or use illicit drugs.   Family history:   Family History  Problem Relation Age of Onset  . Heart attack Mother   . Heart attack Sister   . Lung cancer Brother   . Lung cancer Sister   . Other Father     sudden cardiac 1 yo  . Colon cancer Sister   . Cancer Sister   . Heart failure Brother     ROS:  Please see the history of present illness.  All other systems reviewed and negative.   PHYSICAL EXAM: VS:  BP 140/67 mmHg  Pulse 69  Ht 5\' 9"  (1.753 m)  Wt 161 lb (73.029 kg)  BMI 23.76 kg/m2 Well nourished, well developed, in no acute distress HEENT: Pupils are equal round react to light accommodation extraocular movements are intact.  Neck: no JVDNo cervical lymphadenopathy. Cardiac: Regular rate and rhythm without murmurs rubs or gallops. Lungs:  clear to auscultation bilaterally, no wheezing, rhonchi or rales Abd: soft, nontender, positive bowel sounds all quadrants, no hepatosplenomegaly Skeletal skeletal: Moderate tenderness along the vastus lateralis in his left leg. No ecchymosis or edema Ext: no lower extremity edema.  2+ radial and dorsalis pedis pulses. Skin: warm and dry Neuro:  Grossly normal  EKG:  Sinus rhythm left bundle branch block first-degree AV block.  ASSESSMENT AND PLAN:  Problem List Items Addressed This Visit    Syncope    This past Tuesday the patient was at his brother's funeral when he had a syncopal episode. He had been complaining of feeling tired and fatigued earlier that morning and while at funeral in the church, he had been standing for more than an hour for lots of people there and felt kind of warm. He then had a syncopal episode and was caught by 2 gentleman next to him.  EMS arrived and EKG showed sinus bradycardia first degree AV block and left bundle branch block which is chronic his heart rate on today's EKG is 69 and was similar on his previous office visit.  His episode certainly sounds vasovagal. He wore a heart monitor after the  last episode which was uneventful.  His heart rate in the EMS EKG certainly slower than his baseline should expect in a vasovagal event however, he was reporting fatigue earlier in the day which could be concerning for severe bradycardia/AV block. Certainly the stress of the funeral may play a role.  Given the distance between events we should consider doing a [employer]'s loop recorder.  We'll schedule him to one of the EP MDs.  He is not orthostatic.      Left bundle branch block -  Primary   Relevant Orders   EKG 12-Lead        Left thigh pain. Could be inflammation and the IT band. Recommended switching to naproxen and if does not improve in a week see PCP.  There is no edema in his leg to suggest clot in these had no long days of travel prior to Friday.

## 2015-01-05 NOTE — Patient Instructions (Signed)
Your physician recommends that you continue on your current medications as directed. Please refer to the Current Medication list given to you today.    FOLLOW UP WITH  EP DR FOR LOOP RECORDER AS SOON AS POSSIBLE

## 2015-01-05 NOTE — Assessment & Plan Note (Addendum)
This past Tuesday the patient was at his brother's funeral when he had a syncopal episode. He had been complaining of feeling tired and fatigued earlier that morning and while at funeral in the church, he had been standing for more than an hour for lots of people there and felt kind of warm. He then had a syncopal episode and was caught by 2 gentleman next to him.  EMS arrived and EKG showed sinus bradycardia first degree AV block and left bundle branch block which is chronic his heart rate on today's EKG is 69 and was similar on his previous office visit.  His episode certainly sounds vasovagal. He wore a heart monitor after the last episode which was uneventful.  His heart rate in the EMS EKG certainly slower than his baseline should expect in a vasovagal event however, he was reporting fatigue earlier in the day which could be concerning for severe bradycardia/AV block. Certainly the stress of the funeral may play a role.  Given the distance between events we should consider doing a [employer]'s loop recorder.  We'll schedule him to one of the EP MDs.  He is not orthostatic.

## 2015-01-10 ENCOUNTER — Other Ambulatory Visit: Payer: Self-pay | Admitting: Family Medicine

## 2015-01-10 ENCOUNTER — Ambulatory Visit
Admission: RE | Admit: 2015-01-10 | Discharge: 2015-01-10 | Disposition: A | Discharge status: Home / Self Care | Payer: Medicare Other | Source: Ambulatory Visit | Attending: Family Medicine | Admitting: Family Medicine

## 2015-01-10 DIAGNOSIS — M79652 Pain in left thigh: Secondary | ICD-10-CM | POA: Diagnosis not present

## 2015-01-10 DIAGNOSIS — M25552 Pain in left hip: Secondary | ICD-10-CM

## 2015-01-11 ENCOUNTER — Encounter: Payer: Self-pay | Admitting: *Deleted

## 2015-01-11 ENCOUNTER — Ambulatory Visit (INDEPENDENT_AMBULATORY_CARE_PROVIDER_SITE_OTHER): Payer: Medicare Other | Admitting: Internal Medicine

## 2015-01-11 ENCOUNTER — Encounter: Payer: Self-pay | Admitting: Internal Medicine

## 2015-01-11 VITALS — BP 146/70 | HR 84 | Ht 69.0 in | Wt 155.6 lb

## 2015-01-11 DIAGNOSIS — R0789 Other chest pain: Secondary | ICD-10-CM | POA: Diagnosis not present

## 2015-01-11 DIAGNOSIS — R55 Syncope and collapse: Secondary | ICD-10-CM | POA: Diagnosis not present

## 2015-01-11 DIAGNOSIS — I447 Left bundle-branch block, unspecified: Secondary | ICD-10-CM | POA: Diagnosis not present

## 2015-01-11 NOTE — Patient Instructions (Signed)
Your physician recommends that you continue on your current medications as directed. Please refer to the Current Medication list given to you today.  Your physician has recommended that you have a LINQ (loop recorder) inserted.  Please see the instruction sheet given to you today for more information.  Your wound check is scheduled for 01/25/15 at 2:00 pm at [address]

## 2015-01-11 NOTE — Assessment & Plan Note (Signed)
He has no evidence of angina. He will undergo watchful waiting.

## 2015-01-11 NOTE — Assessment & Plan Note (Signed)
He is certainly at increased risk for a Stokes Adams attack and I have discussed this with the patient and his wife. Because his symptoms are a bit atypical for this and in some ways more likely neurally mediated, I have recommended with avoid going immediately to a PPM. He has been instructed not to drive.

## 2015-01-11 NOTE — Assessment & Plan Note (Signed)
The etiology of his syncope is unclear. He may have Stokes-Adams attacks, or he may be having neurally mediated syncope or both. I have discussed the treatment options with the patient and his wife. I have recommended insertion of an ILR. They wish to proceed.

## 2015-01-11 NOTE — Progress Notes (Signed)
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 He has had two episodes in the past 3 months. The first while sitting in Sunday school. He has some trouble remembering the particulars but it appears he had little warning that he was going to pass out. He fell out the chair. He did not injure himself. He did not lose control of bowel or bladder. [place] in the [place] home 2 weeks ago when he had another episode which was characterized by the sensation of leg pain, followed by diaphoresis and syncope. He awoke feeling ok. The patient has had longstanding baseline LBBB. He has no remote history of syncope. He has been diagnosed with dementia.  Allergies  Allergen Reactions  . Penicillins Other (See Comments)    Patient states that it makes him go "crazy"  . Codeine Other (See Comments)    Patient states that it makes him go "crazy"     Current Outpatient Prescriptions  Medication Sig Dispense Refill  . donepezil (ARICEPT) 10 MG tablet Take 10 mg by mouth daily.   1  . fish oil-omega-3 fatty acids 1000 MG capsule Take 1 g by mouth daily.      . Multiple Vitamins-Minerals (ICAPS PO) Take 1 capsule by mouth daily.    . traMADol (ULTRAM) 50 MG tablet Take 1 tablet by mouth every 6 (six) hours as needed (pain).   0  . Ascorbic Acid (VITAMIN C) 1000 MG tablet Take 1,000 mg by mouth daily.    Marland Kitchen aspirin EC 81 MG tablet Take 81 mg by mouth at bedtime.    . cholecalciferol (VITAMIN D) 1000 UNITS tablet Take 1,000 Units by mouth daily.    . predniSONE (DELTASONE) 20 MG tablet Take 20 mg by mouth See admin instructions. 9 day tapered course filled 01/10/15:  Take 1 tablet (20 mg) 3 times daily for 3 days, then take 1 tablet (20 mg) 2 times daily for 3 days, then take 1 tablet (20 mg) daily for 3 days  0   No current facility-administered medications for this visit.     Past Medical History  Diagnosis Date  . Prostate ca     radiation seeds  . Arthritis   .  LBBB (left bundle branch block)   . Hx of echocardiogram     a. Echo (09/2011):  EF 55-60%, mild MR, mild AI, mild LAE, PASP 35;  b. Echo (10/25/2013): Mild LVH, EF 55-60%, normal wall motion, mild AI, mild MR, PASP 43  . Hx of cardiovascular stress test     Adenosine Myoview (09/2011):  No ischemia, EF 68%, low risk.  . Coronary artery disease     ROS:   All systems reviewed and negative except as noted in the HPI.   Past Surgical History  Procedure Laterality Date  . Appendectomy    . Radioactive seed implant    . Root canal    . Tonsillectomy    . Lasik    . Cataract extraction       Family History  Problem Relation Age of Onset  . Heart attack Mother   . Heart attack Sister   . Lung cancer Brother   . Lung cancer Sister   . Other Father     sudden cardiac 15 yo  . Colon cancer Sister   . Cancer Sister   . Heart failure Brother      History   Social History  .  Marital Status: Married    Spouse Name: N/A  . Number of Children: N/A  . Years of Education: N/A   Occupational History  . Not on file.   Social History Main Topics  . Smoking status: Never Smoker   . Smokeless tobacco: Not on file  . Alcohol Use: No  . Drug Use: No  . Sexual Activity: Not on file   Other Topics Concern  . 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             BP 146/70 mmHg  Pulse 84  Ht 5\' 9"  (1.753 m)  Wt 155 lb 9.6 oz (70.58 kg)  BMI 22.97 kg/m2  Physical Exam:  Well appearing elderly man, NAD HEENT: Unremarkable Neck:  No JVD, no thyromegally Lymphatics:  No adenopathy Back:  No CVA tenderness Lungs:  Clear with no wheezes HEART:  Regular rate rhythm, no murmurs, no rubs, no clicks Abd:  soft, positive bowel sounds, no organomegally, no rebound, no guarding Ext:  2 plus pulses, no edema, no cyanosis, no clubbing Skin:  No rashes no nodules Neuro:  CN II through XII intact, motor grossly intact  EKG - NSR with  LBBB  Assess/Plan:

## 2015-01-12 ENCOUNTER — Ambulatory Visit (HOSPITAL_COMMUNITY)
Admission: RE | Admit: 2015-01-12 | Discharge: 2015-01-12 | Disposition: A | Discharge status: Home / Self Care | Payer: Medicare Other | Source: Ambulatory Visit | Attending: Internal Medicine | Admitting: Internal Medicine

## 2015-01-12 ENCOUNTER — Encounter (HOSPITAL_COMMUNITY)
Admission: RE | Disposition: A | Discharge status: Home / Self Care | Payer: Self-pay | Source: Ambulatory Visit | Attending: Internal Medicine

## 2015-01-12 DIAGNOSIS — I471 Supraventricular tachycardia, unspecified: Secondary | ICD-10-CM | POA: Diagnosis present

## 2015-01-12 DIAGNOSIS — Z79891 Long term (current) use of opiate analgesic: Secondary | ICD-10-CM | POA: Diagnosis not present

## 2015-01-12 DIAGNOSIS — R55 Syncope and collapse: Secondary | ICD-10-CM | POA: Insufficient documentation

## 2015-01-12 DIAGNOSIS — Z8546 Personal history of malignant neoplasm of prostate: Secondary | ICD-10-CM | POA: Diagnosis not present

## 2015-01-12 DIAGNOSIS — Z79899 Other long term (current) drug therapy: Secondary | ICD-10-CM | POA: Insufficient documentation

## 2015-01-12 DIAGNOSIS — I2510 Atherosclerotic heart disease of native coronary artery without angina pectoris: Secondary | ICD-10-CM | POA: Diagnosis not present

## 2015-01-12 DIAGNOSIS — Z7952 Long term (current) use of systemic steroids: Secondary | ICD-10-CM | POA: Diagnosis not present

## 2015-01-12 DIAGNOSIS — Z7982 Long term (current) use of aspirin: Secondary | ICD-10-CM | POA: Diagnosis not present

## 2015-01-12 DIAGNOSIS — M1990 Unspecified osteoarthritis, unspecified site: Secondary | ICD-10-CM | POA: Insufficient documentation

## 2015-01-12 DIAGNOSIS — F0390 Unspecified dementia without behavioral disturbance: Secondary | ICD-10-CM | POA: Diagnosis not present

## 2015-01-12 HISTORY — PX: LOOP RECORDER IMPLANT: SHX5477

## 2015-01-12 SURGERY — LOOP RECORDER IMPLANT
Anesthesia: LOCAL

## 2015-01-12 MED ORDER — LIDOCAINE-EPINEPHRINE 1 %-1:100000 IJ SOLN
INTRAMUSCULAR | Status: AC
  Filled 2015-01-12: qty 1

## 2015-01-12 NOTE — CV Procedure (Signed)
Electrophysiology procedure note  Procedure: Insertion of an implantable loop recorder  Preoperative diagnosis: Unexplained syncope  Postoperative diagnosis: Unexplained syncope  Description of the procedure: After informed consent was obtained, the patient was prepped and draped in the usual manner. 20 cc of lidocaine was infiltrated into the left pectoral region. A 1 cm incision was carried out. The Medtronic implantable loop recorder, serial [id number], was inserted under the skin. The R-wave measures 0.2 mV. Benzoin and Steri-Strips her pain on the skin. A bandage was placed and pressure was held. The patient was recovered in the usual manner.  Complications: There were no immediate procedure complications  Conclusion: Successful insertion of a Medtronic implantable loop recorder in a patient with unexplained syncope  Cristopher Peru, M.D.

## 2015-01-12 NOTE — Interval H&P Note (Signed)
History and Physical Interval Note:  01/12/2015 2:32 PM  Raymond Hinton  has presented today for surgery, with the diagnosis of syncope  The various methods of treatment have been discussed with the patient and family. After consideration of risks, benefits and other options for treatment, the patient has consented to  Procedure(s): LOOP RECORDER IMPLANT (N/A) as a surgical intervention .  The patient's history has been reviewed, patient examined, no change in status, stable for surgery.  I have reviewed the patient's chart and labs.  Questions were answered to the patient's satisfaction.     [name].D.

## 2015-01-12 NOTE — H&P (View-Only) (Signed)
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 He has had two episodes in the past 3 months. The first while sitting in Sunday school. He has some trouble remembering the particulars but it appears he had little warning that he was going to pass out. He fell out the chair. He did not injure himself. He did not lose control of bowel or bladder. He was in the funeral home 2 weeks ago when he had another episode which was characterized by the sensation of leg pain, followed by diaphoresis and syncope. He awoke feeling ok. The patient has had longstanding baseline LBBB. He has no remote history of syncope. He has been diagnosed with dementia.  Allergies  Allergen Reactions  . Penicillins Other (See Comments)    Patient states that it makes him go "crazy"  . Codeine Other (See Comments)    Patient states that it makes him go "crazy"     Current Outpatient Prescriptions  Medication Sig Dispense Refill  . donepezil (ARICEPT) 10 MG tablet Take 10 mg by mouth daily.   1  . fish oil-omega-3 fatty acids 1000 MG capsule Take 1 g by mouth daily.      . Multiple Vitamins-Minerals (ICAPS PO) Take 1 capsule by mouth daily.    . traMADol (ULTRAM) 50 MG tablet Take 1 tablet by mouth every 6 (six) hours as needed (pain).   0  . Ascorbic Acid (VITAMIN C) 1000 MG tablet Take 1,000 mg by mouth daily.    Marland Kitchen aspirin EC 81 MG tablet Take 81 mg by mouth at bedtime.    . cholecalciferol (VITAMIN D) 1000 UNITS tablet Take 1,000 Units by mouth daily.    . predniSONE (DELTASONE) 20 MG tablet Take 20 mg by mouth See admin instructions. 9 day tapered course filled 01/10/15:  Take 1 tablet (20 mg) 3 times daily for 3 days, then take 1 tablet (20 mg) 2 times daily for 3 days, then take 1 tablet (20 mg) daily for 3 days  0   No current facility-administered medications for this visit.     Past Medical History  Diagnosis Date  . Prostate ca     radiation seeds  . Arthritis   .  LBBB (left bundle branch block)   . Hx of echocardiogram     a. Echo (09/2011):  EF 55-60%, mild MR, mild AI, mild LAE, PASP 35;  b. Echo (10/25/2013): Mild LVH, EF 55-60%, normal wall motion, mild AI, mild MR, PASP 43  . Hx of cardiovascular stress test     Adenosine Myoview (09/2011):  No ischemia, EF 68%, low risk.  . Coronary artery disease     ROS:   All systems reviewed and negative except as noted in the HPI.   Past Surgical History  Procedure Laterality Date  . Appendectomy    . Radioactive seed implant    . Root canal    . Tonsillectomy    . Lasik    . Cataract extraction       Family History  Problem Relation Age of Onset  . Heart attack Mother   . Heart attack Sister   . Lung cancer Brother   . Lung cancer Sister   . Other Father     sudden cardiac 56 yo  . Colon cancer Sister   . Cancer Sister   . Heart failure Brother      History   Social History  .  Marital Status: Married    Spouse Name: N/A  . Number of Children: N/A  . Years of Education: N/A   Occupational History  . Not on file.   Social History Main Topics  . Smoking status: Never Smoker   . Smokeless tobacco: Not on file  . Alcohol Use: No  . Drug Use: No  . Sexual Activity: Not on file   Other Topics Concern  . 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             BP 146/70 mmHg  Pulse 84  Ht 5\' 9"  (1.753 m)  Wt 155 lb 9.6 oz (70.58 kg)  BMI 22.97 kg/m2  Physical Exam:  Well appearing elderly man, NAD HEENT: Unremarkable Neck:  No JVD, no thyromegally Lymphatics:  No adenopathy Back:  No CVA tenderness Lungs:  Clear with no wheezes HEART:  Regular rate rhythm, no murmurs, no rubs, no clicks Abd:  soft, positive bowel sounds, no organomegally, no rebound, no guarding Ext:  2 plus pulses, no edema, no cyanosis, no clubbing Skin:  No rashes no nodules Neuro:  CN II through XII intact, motor grossly intact  EKG - NSR with  LBBB  Assess/Plan:

## 2015-01-12 NOTE — CV Procedure (Deleted)
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 Comprehensive EP study.  2. Coronary sinus pacing and recording.  3. Mapping of supraventricular tachycardia.  4. Radiofrequency ablation of supraventricular tachycardia.  5. Arrhythmia induction with isuprel infused   INTRODUCTION: Raymond Hinton is a 81 y.o. male with a history of symptomatic recurrent short RP SVT who presents today for EP study and radiofrequency ablation. The patient has had recurrent symptomatic SVT. He has failed medical therapy with AV nodal blocking agents. She now presents for EP study and radiofrequency ablation of SVT.   DESCRIPTION OF PROCEDURE: Informed written consent was obtained and the patient was brought to the Electrophysiology Lab in the fasting state. The patient was adequately sedated with intravenous medication as outlined in the anesthesia report. The patient's right neck and groin was prepped and draped in the usual sterile fashion by the EP Lab staff. Using a percutaneous Seldinger technique, a 6 F hemostasis sheath was placed into the right internal jugular vein. A 6 F curved hexapolar catheter was advised through the RIJ into the coronary sinus for pacing and recording. Two 6-French and one 8-French hemostasis sheaths were placed  into the right common femoral vein. Two 6-French quadripolar Josephson catheters were introduced through the right common femoral vein and advanced into the His bundle and right ventricular apex positions for recording and pacing.   Presenting Measurements: The patient presented to the Electrophysiology Lab in sinus rhythm. The PR interval was 125 msec with a QRS of 103 msec and a Qt of 394 msec. The average RR interval was 824 msec. The AH interval was 741msec and the HV interval was 52 milliseconds.   EP study:  Ventricular pacing was performed which reveals midline concentric  decremental VA conduction with a single retrograde jump but no echo beats of tachycardias. The VA WCL was 360 msec and a VERP 500/229msec.  Rapid atrial pacing was performed with reveals PR >> RR with tachycardia not induced at 360 msec. AEST was performed which revealed multiple prolonged AH jumps with echo beats. Tachycardia was not induced. The AVNERP was 500/250msec.  Isuprel was infused at 1-7mcg/min with an adequate acceleration in HR response observed. PR remained >> RR. Tachycardia was not induced. AEST was performed which again revealed multiple AH jumps. RAP at 300 on isuprel resulted in induction of a sustained narrow complex SVT. The tachycardia cycle length was 380msec. The surface EKG was consistent with the patient's clinical tachycardia. VA time during tachycardia measured 73msec with earliest retrograde atrial activation recorded from the His electrogram. The tachycardia terminated with ventricular pacing at 290 ms. This was a reproducible event. V pacing was performed during tachycardia which revealed a VAV response. PVC's placed at the time of his bundle refractoriness resulted in no atrial pre-excitation. The patient was therefore felt to have classic AV nodal reentrant tachycardia. I therefore elected to perform slow pathway modification.  Isuprel was discontinued and allowed to washout.   Ablation:  A 7FCordis 50mm ablation catheter was therefore advanced through the right femoral vein and advanced into the right atrium. Mapping of Koch's triangle was performed which revealed a standard sized triangle. A single lesion was delivered at site 9 in Koch's triangle with a target temperature of 60 degrees of 50 watts for 60 seconds. Good accelerated junctional rhythm was observed.   Measurements following ablation:  Following ablation, Rapid atrial pacing was again performed with PR<RR and an  AV WCL of 430 msec. AEST was performed which revealed a single AH jump but no echo beats and no  tachycardia observed. The AVN ERP was 500/240 msec. V pacing was performed which revealed midline concentric decremental VA conduction with a single retrograde jump but no echo beats of tachycardias. The VA WCL was 360 msec and a VERP 500/210msec. There was a single retrograde jump with echo beat but no arrhythmias induced.  Isuprel was again infused at 2 mcg/min with an adequate acceleration in heart rate response. RAP revealed PR<RR with an AVWCL of 330 msec.  AEST was performed which revealed no AH jumps, echo beats, or tachycardias induced.  No arrhythmias were induced. Following ablation the AH interval was 60 msec with an HV interval of 44 msec. The procedure was therefore considered completed. All catheters were removed and the sheaths were aspirated and flushed. The sheaths were removed and hemostasis was assured. EBL<16ml. There were no early apparent complications.   CONCLUSIONS:  1. Sinus rhythm upon presentation.  2. The patient had dual AV nodal physiology with inducible classic AV nodal reentrant tachycardia using isuprel, and there were no other accessory pathways or arrhythmias induced  3. Successful radiofrequency modification of the slow AV nodal pathway  4. No inducible arrhythmias following ablation.  5. No early apparent complications.   [name].D.

## 2015-01-13 ENCOUNTER — Encounter (HOSPITAL_COMMUNITY): Payer: Self-pay | Admitting: Internal Medicine

## 2015-01-13 ENCOUNTER — Telehealth: Payer: Self-pay | Admitting: Internal Medicine

## 2015-01-13 ENCOUNTER — Ambulatory Visit (INDEPENDENT_AMBULATORY_CARE_PROVIDER_SITE_OTHER): Payer: Medicare Other | Admitting: *Deleted

## 2015-01-13 DIAGNOSIS — R55 Syncope and collapse: Secondary | ICD-10-CM

## 2015-01-13 LAB — MDC_IDC_ENUM_SESS_TYPE_INCLINIC

## 2015-01-13 NOTE — Progress Notes (Signed)
Pt bleeding at incision site post loop implant. Pt takes 325mg  ASA daily on his own accord; not prescribed.  I spoke w/ pharmacist. Gay Filler advised pt cease all aspirin for one week. If he desires to continue taking aspirin after one week, he has been instructed to only take 81mg .   I applied steri-strips and a bandage over incision. Pt aware not to remove steri-strips until wound ck appt. I gave his wife extra steri-strips in case bleeding saturates current application. I instructed her how to apply them if they need replacing.   Device not interrogated. Pt aware ROV w/ device clinic for originally scheduled wound ck on 01/25/15.

## 2015-01-13 NOTE — Telephone Encounter (Signed)
New problem   Pt's wife stated pt had loop recorder sx yesterday and this morning pt is bleeding around site. Please advise

## 2015-01-13 NOTE — Telephone Encounter (Signed)
Appt made this morning w/ device clinic at 9:30 to assess bleeding.

## 2015-01-25 ENCOUNTER — Ambulatory Visit (INDEPENDENT_AMBULATORY_CARE_PROVIDER_SITE_OTHER): Payer: Medicare Other | Admitting: *Deleted

## 2015-01-25 DIAGNOSIS — R55 Syncope and collapse: Secondary | ICD-10-CM

## 2015-01-25 LAB — MDC_IDC_ENUM_SESS_TYPE_INCLINIC
Date Time Interrogation Session: 20160420141915
Zone Setting Detection Interval: 2000 ms
Zone Setting Detection Interval: 3000 ms
Zone Setting Detection Interval: 410 ms

## 2015-01-25 NOTE — Progress Notes (Signed)
ILR Wound check appointment. Steri-strips removed. Wound without redness or edema. Pt with 0 tachy episodes; 0 brady episodes; 0 asystole. Carelink summary reports QMO & ROV w/ Dr. Lovena Le in 14mo.

## 2015-01-28 DIAGNOSIS — M4126 Other idiopathic scoliosis, lumbar region: Secondary | ICD-10-CM | POA: Diagnosis not present

## 2015-01-28 DIAGNOSIS — M25552 Pain in left hip: Secondary | ICD-10-CM | POA: Diagnosis not present

## 2015-02-10 ENCOUNTER — Encounter: Payer: Self-pay | Admitting: Internal Medicine

## 2015-02-10 ENCOUNTER — Ambulatory Visit (INDEPENDENT_AMBULATORY_CARE_PROVIDER_SITE_OTHER): Payer: Medicare Other | Admitting: *Deleted

## 2015-02-10 DIAGNOSIS — R55 Syncope and collapse: Secondary | ICD-10-CM

## 2015-02-17 ENCOUNTER — Other Ambulatory Visit: Payer: Self-pay | Admitting: Family Medicine

## 2015-02-17 DIAGNOSIS — R1310 Dysphagia, unspecified: Secondary | ICD-10-CM

## 2015-02-17 NOTE — Progress Notes (Signed)
Loop recorder 

## 2015-02-21 ENCOUNTER — Ambulatory Visit
Admission: RE | Admit: 2015-02-21 | Discharge: 2015-02-21 | Disposition: A | Discharge status: Home / Self Care | Payer: Medicare Other | Source: Ambulatory Visit | Attending: Family Medicine | Admitting: Family Medicine

## 2015-02-21 DIAGNOSIS — R1310 Dysphagia, unspecified: Secondary | ICD-10-CM

## 2015-02-21 DIAGNOSIS — K449 Diaphragmatic hernia without obstruction or gangrene: Secondary | ICD-10-CM | POA: Diagnosis not present

## 2015-02-21 DIAGNOSIS — K222 Esophageal obstruction: Secondary | ICD-10-CM | POA: Diagnosis not present

## 2015-02-23 DIAGNOSIS — H21301 Idiopathic cysts of iris, ciliary body or anterior chamber, right eye: Secondary | ICD-10-CM | POA: Diagnosis not present

## 2015-02-23 DIAGNOSIS — Z885 Allergy status to narcotic agent status: Secondary | ICD-10-CM | POA: Diagnosis not present

## 2015-02-23 DIAGNOSIS — Z7952 Long term (current) use of systemic steroids: Secondary | ICD-10-CM | POA: Diagnosis not present

## 2015-02-23 DIAGNOSIS — Z7982 Long term (current) use of aspirin: Secondary | ICD-10-CM | POA: Diagnosis not present

## 2015-02-23 DIAGNOSIS — Z961 Presence of intraocular lens: Secondary | ICD-10-CM | POA: Diagnosis not present

## 2015-02-23 DIAGNOSIS — Z880 Allergy status to penicillin: Secondary | ICD-10-CM | POA: Diagnosis not present

## 2015-02-24 DIAGNOSIS — K222 Esophageal obstruction: Secondary | ICD-10-CM | POA: Diagnosis not present

## 2015-02-24 DIAGNOSIS — R1310 Dysphagia, unspecified: Secondary | ICD-10-CM | POA: Diagnosis not present

## 2015-02-27 DIAGNOSIS — M25552 Pain in left hip: Secondary | ICD-10-CM | POA: Diagnosis not present

## 2015-02-28 ENCOUNTER — Telehealth: Payer: Self-pay | Admitting: *Deleted

## 2015-02-28 DIAGNOSIS — R1310 Dysphagia, unspecified: Secondary | ICD-10-CM | POA: Diagnosis not present

## 2015-02-28 DIAGNOSIS — K228 Other specified diseases of esophagus: Secondary | ICD-10-CM | POA: Diagnosis not present

## 2015-02-28 DIAGNOSIS — K210 Gastro-esophageal reflux disease with esophagitis: Secondary | ICD-10-CM | POA: Diagnosis not present

## 2015-02-28 DIAGNOSIS — R933 Abnormal findings on diagnostic imaging of other parts of digestive tract: Secondary | ICD-10-CM | POA: Diagnosis not present

## 2015-02-28 DIAGNOSIS — K2960 Other gastritis without bleeding: Secondary | ICD-10-CM | POA: Diagnosis not present

## 2015-02-28 DIAGNOSIS — K222 Esophageal obstruction: Secondary | ICD-10-CM | POA: Diagnosis not present

## 2015-02-28 NOTE — Telephone Encounter (Signed)
LMOM for return call. Tachy episode recorded on 5-20 around 2018 showing nst lasting 17 beats. ? Symptoms

## 2015-03-02 LAB — CUP PACEART REMOTE DEVICE CHECK: Date Time Interrogation Session: 20160508040500

## 2015-03-13 ENCOUNTER — Ambulatory Visit (INDEPENDENT_AMBULATORY_CARE_PROVIDER_SITE_OTHER): Payer: Medicare Other | Admitting: *Deleted

## 2015-03-13 DIAGNOSIS — R55 Syncope and collapse: Secondary | ICD-10-CM

## 2015-03-15 NOTE — Progress Notes (Signed)
Loop recorder 

## 2015-03-20 DIAGNOSIS — R413 Other amnesia: Secondary | ICD-10-CM | POA: Diagnosis not present

## 2015-03-20 DIAGNOSIS — R5383 Other fatigue: Secondary | ICD-10-CM | POA: Diagnosis not present

## 2015-03-20 DIAGNOSIS — F329 Major depressive disorder, single episode, unspecified: Secondary | ICD-10-CM | POA: Diagnosis not present

## 2015-03-20 DIAGNOSIS — M150 Primary generalized (osteo)arthritis: Secondary | ICD-10-CM | POA: Diagnosis not present

## 2015-03-23 LAB — CUP PACEART REMOTE DEVICE CHECK: Date Time Interrogation Session: 20160521040500

## 2015-03-27 ENCOUNTER — Encounter: Payer: Self-pay | Admitting: Internal Medicine

## 2015-03-28 ENCOUNTER — Encounter: Payer: Self-pay | Admitting: Internal Medicine

## 2015-03-31 ENCOUNTER — Encounter: Payer: Self-pay | Admitting: Internal Medicine

## 2015-04-03 ENCOUNTER — Other Ambulatory Visit: Payer: Self-pay

## 2015-04-12 ENCOUNTER — Ambulatory Visit (INDEPENDENT_AMBULATORY_CARE_PROVIDER_SITE_OTHER): Payer: Medicare Other | Admitting: *Deleted

## 2015-04-12 DIAGNOSIS — R55 Syncope and collapse: Secondary | ICD-10-CM | POA: Diagnosis not present

## 2015-04-13 NOTE — Progress Notes (Signed)
Loop recorder 

## 2015-04-18 ENCOUNTER — Telehealth: Payer: Self-pay | Admitting: Internal Medicine

## 2015-04-18 NOTE — Telephone Encounter (Signed)
New Message   Pt wife calling to speak w/ RN about when pt can begin to take aspirin again. Please call back and discuss. She insisted that if she does not answer to leave detailed message on VM.

## 2015-04-18 NOTE — Telephone Encounter (Signed)
He has never restarted the ASA 81 mg after it was stopped with LINQ implant.  I let her know they could resume at same dose

## 2015-04-20 DIAGNOSIS — Z880 Allergy status to penicillin: Secondary | ICD-10-CM | POA: Diagnosis not present

## 2015-04-20 DIAGNOSIS — H53009 Unspecified amblyopia, unspecified eye: Secondary | ICD-10-CM | POA: Diagnosis not present

## 2015-04-20 DIAGNOSIS — Z885 Allergy status to narcotic agent status: Secondary | ICD-10-CM | POA: Diagnosis not present

## 2015-04-20 DIAGNOSIS — H21301 Idiopathic cysts of iris, ciliary body or anterior chamber, right eye: Secondary | ICD-10-CM | POA: Diagnosis not present

## 2015-04-20 DIAGNOSIS — Z9842 Cataract extraction status, left eye: Secondary | ICD-10-CM | POA: Diagnosis not present

## 2015-04-20 DIAGNOSIS — Z961 Presence of intraocular lens: Secondary | ICD-10-CM | POA: Diagnosis not present

## 2015-04-20 DIAGNOSIS — I447 Left bundle-branch block, unspecified: Secondary | ICD-10-CM | POA: Diagnosis not present

## 2015-04-20 DIAGNOSIS — Z7982 Long term (current) use of aspirin: Secondary | ICD-10-CM | POA: Diagnosis not present

## 2015-04-20 DIAGNOSIS — I471 Supraventricular tachycardia: Secondary | ICD-10-CM | POA: Diagnosis not present

## 2015-04-20 DIAGNOSIS — Z9841 Cataract extraction status, right eye: Secondary | ICD-10-CM | POA: Diagnosis not present

## 2015-04-20 DIAGNOSIS — Z79899 Other long term (current) drug therapy: Secondary | ICD-10-CM | POA: Diagnosis not present

## 2015-04-20 DIAGNOSIS — Z8546 Personal history of malignant neoplasm of prostate: Secondary | ICD-10-CM | POA: Diagnosis not present

## 2015-04-20 DIAGNOSIS — M1990 Unspecified osteoarthritis, unspecified site: Secondary | ICD-10-CM | POA: Diagnosis not present

## 2015-04-20 DIAGNOSIS — Z79891 Long term (current) use of opiate analgesic: Secondary | ICD-10-CM | POA: Diagnosis not present

## 2015-04-21 DIAGNOSIS — H21301 Idiopathic cysts of iris, ciliary body or anterior chamber, right eye: Secondary | ICD-10-CM | POA: Diagnosis not present

## 2015-05-11 ENCOUNTER — Encounter: Payer: Self-pay | Admitting: Internal Medicine

## 2015-05-12 ENCOUNTER — Ambulatory Visit (INDEPENDENT_AMBULATORY_CARE_PROVIDER_SITE_OTHER): Payer: Medicare Other | Admitting: *Deleted

## 2015-05-12 DIAGNOSIS — R55 Syncope and collapse: Secondary | ICD-10-CM

## 2015-05-16 NOTE — Progress Notes (Signed)
Loop recorder 

## 2015-05-22 DIAGNOSIS — C61 Malignant neoplasm of prostate: Secondary | ICD-10-CM | POA: Diagnosis not present

## 2015-05-22 DIAGNOSIS — Z8546 Personal history of malignant neoplasm of prostate: Secondary | ICD-10-CM | POA: Diagnosis not present

## 2015-05-22 LAB — CUP PACEART REMOTE DEVICE CHECK: Date Time Interrogation Session: 20160815081136

## 2015-05-31 DIAGNOSIS — Z7982 Long term (current) use of aspirin: Secondary | ICD-10-CM | POA: Diagnosis not present

## 2015-05-31 DIAGNOSIS — H21301 Idiopathic cysts of iris, ciliary body or anterior chamber, right eye: Secondary | ICD-10-CM | POA: Diagnosis not present

## 2015-05-31 DIAGNOSIS — Z48810 Encounter for surgical aftercare following surgery on the sense organs: Secondary | ICD-10-CM | POA: Diagnosis not present

## 2015-06-07 ENCOUNTER — Encounter: Payer: Self-pay | Admitting: Internal Medicine

## 2015-06-09 ENCOUNTER — Ambulatory Visit (INDEPENDENT_AMBULATORY_CARE_PROVIDER_SITE_OTHER): Payer: Medicare Other | Admitting: *Deleted

## 2015-06-09 DIAGNOSIS — R55 Syncope and collapse: Secondary | ICD-10-CM | POA: Diagnosis not present

## 2015-06-14 NOTE — Progress Notes (Signed)
Loop recorder 

## 2015-06-23 LAB — CUP PACEART REMOTE DEVICE CHECK: Date Time Interrogation Session: 20160916092627

## 2015-06-23 NOTE — Progress Notes (Signed)
Carelink summary report received. Battery status OK. Normal device function. No new symptom episodes, tachy episodes, brady, or pause episodes. No new AF episodes. Monthly summary reports and ROV with GT in 01/2016.

## 2015-07-11 ENCOUNTER — Telehealth: Payer: Self-pay | Admitting: *Deleted

## 2015-07-11 ENCOUNTER — Ambulatory Visit (INDEPENDENT_AMBULATORY_CARE_PROVIDER_SITE_OTHER): Payer: Medicare Other | Admitting: *Deleted

## 2015-07-11 DIAGNOSIS — R55 Syncope and collapse: Secondary | ICD-10-CM | POA: Diagnosis not present

## 2015-07-11 NOTE — Telephone Encounter (Signed)
Patient's wife states that the patient experienced an "episode of dizziness" on Sunday after Sunday school.  She states that every time he bends over or changes position that he has a brief episode of dizziness.  No episode on LINQ transmissions that correlate with patient's symptoms on Sunday.  Spoke with patient, who reiterated that his dizziness is brief and correlates with changes in position.  He denies having lost consciousness.  Patient states that he does not recall any specific symptoms on 07/06/15 or 07/08/15 at the times of the ~3 sec pause episodes on his LINQ transmissions.  Patient aware to call with worsening symptoms, questions, or concerns.  Patient denies any additional questions or concerns at this time.  Episodes placed in Dr. Macon Large folder for review.

## 2015-07-12 ENCOUNTER — Encounter: Payer: Self-pay | Admitting: Internal Medicine

## 2015-07-12 NOTE — Progress Notes (Signed)
Loop recorder 

## 2015-07-27 ENCOUNTER — Telehealth: Payer: Self-pay | Admitting: *Deleted

## 2015-07-27 NOTE — Telephone Encounter (Signed)
Called patient regarding pause episode on LINQ transmission from 07/23/15.  Patient and wife both on the phone, patient states he was at church at the time of the episode and that he was asymptomatic.  His wife is concerned that occasionally his blood sugar drops during the church service and causes the patient to feel dizzy, she states that they do not eat breakfast before the service.  She wonders how we can distinguish dizziness from a pause episode versus dizziness from a hypoglycemic event.  I explained to Mrs. Ginsberg that the loop recorder monitors his heart rate and rhythm and marks episodes, but it has no way to monitor his blood sugar.  I acknowledged that the symptoms are similar, and I encouraged her to have the patient test his blood sugar during the church services if he is having an episode of dizziness.  She states that she will start bringing a "worship snack" with her to the services so that the patient can eat if he needs to.  I advised that if she or the patient has questions or concerns about his blood sugars, they should contact his PCP for follow-up.  I advised that if they have any cardiac or symptom episode-related questions, I am happy to speak with them.  Patient and his wife deny any additional cardiac-related questions at this time and voice appreciation for call.  Patient and wife aware to call with worsening symptoms or concerns.  Episode placed in Dr. Macon Large folder for review.

## 2015-08-01 ENCOUNTER — Encounter: Payer: Self-pay | Admitting: Internal Medicine

## 2015-08-01 LAB — CUP PACEART REMOTE DEVICE CHECK: MDC IDC SESS DTM: 20161004200806

## 2015-08-01 NOTE — Progress Notes (Signed)
Carelink summary report received. Battery status OK. Normal device function. No new symptom, brady, or AF episodes. 1 tachy episode--hx SVT. 2 pause episodes, both ~3 sec, patient asymptomatic. Monthly summary reports and ROV with GT in 01/2016.

## 2015-08-08 ENCOUNTER — Encounter: Payer: Self-pay | Admitting: Internal Medicine

## 2015-08-10 ENCOUNTER — Ambulatory Visit (INDEPENDENT_AMBULATORY_CARE_PROVIDER_SITE_OTHER): Payer: Medicare Other | Admitting: *Deleted

## 2015-08-10 DIAGNOSIS — R55 Syncope and collapse: Secondary | ICD-10-CM

## 2015-08-11 NOTE — Progress Notes (Signed)
Loop recorder 

## 2015-09-04 DIAGNOSIS — H21301 Idiopathic cysts of iris, ciliary body or anterior chamber, right eye: Secondary | ICD-10-CM | POA: Diagnosis not present

## 2015-09-04 DIAGNOSIS — Z961 Presence of intraocular lens: Secondary | ICD-10-CM | POA: Diagnosis not present

## 2015-09-10 LAB — CUP PACEART REMOTE DEVICE CHECK: Date Time Interrogation Session: 20161103200815

## 2015-09-10 NOTE — Progress Notes (Signed)
Carelink summary report received. Battery status OK. Normal device function. No new symptom, tachy, brady, or AF episodes. 2 pause episodes, longest ~4sec, patient asymptomatic. Monthly summary reports and ROV with GT in 01/2016.

## 2015-09-11 ENCOUNTER — Ambulatory Visit (INDEPENDENT_AMBULATORY_CARE_PROVIDER_SITE_OTHER): Payer: Medicare Other | Admitting: *Deleted

## 2015-09-11 DIAGNOSIS — R55 Syncope and collapse: Secondary | ICD-10-CM

## 2015-09-11 NOTE — Progress Notes (Signed)
Carelink Summary Report / Loop Recorder 

## 2015-09-18 DIAGNOSIS — M150 Primary generalized (osteo)arthritis: Secondary | ICD-10-CM | POA: Diagnosis not present

## 2015-09-18 DIAGNOSIS — R413 Other amnesia: Secondary | ICD-10-CM | POA: Diagnosis not present

## 2015-09-18 DIAGNOSIS — F329 Major depressive disorder, single episode, unspecified: Secondary | ICD-10-CM | POA: Diagnosis not present

## 2015-10-10 ENCOUNTER — Ambulatory Visit (INDEPENDENT_AMBULATORY_CARE_PROVIDER_SITE_OTHER): Payer: Medicare Other | Admitting: *Deleted

## 2015-10-10 DIAGNOSIS — R55 Syncope and collapse: Secondary | ICD-10-CM

## 2015-10-10 DIAGNOSIS — R1310 Dysphagia, unspecified: Secondary | ICD-10-CM | POA: Diagnosis not present

## 2015-10-10 NOTE — Progress Notes (Signed)
Carelink Summary Report / Loop Recorder 

## 2015-11-07 LAB — CUP PACEART REMOTE DEVICE CHECK: Date Time Interrogation Session: 20161203203607

## 2015-11-08 ENCOUNTER — Ambulatory Visit (INDEPENDENT_AMBULATORY_CARE_PROVIDER_SITE_OTHER): Payer: Medicare Other | Admitting: *Deleted

## 2015-11-08 DIAGNOSIS — R55 Syncope and collapse: Secondary | ICD-10-CM

## 2015-11-09 NOTE — Progress Notes (Signed)
Carelink Summary Report / Loop Recorder 

## 2015-11-15 LAB — CUP PACEART REMOTE DEVICE CHECK: MDC IDC SESS DTM: 20170102210850

## 2015-12-08 ENCOUNTER — Ambulatory Visit (INDEPENDENT_AMBULATORY_CARE_PROVIDER_SITE_OTHER): Payer: Medicare Other | Admitting: *Deleted

## 2015-12-08 DIAGNOSIS — R55 Syncope and collapse: Secondary | ICD-10-CM

## 2015-12-10 NOTE — Progress Notes (Signed)
Carelink Summary Report / Loop Recorder 

## 2015-12-12 LAB — CUP PACEART REMOTE DEVICE CHECK: MDC IDC SESS DTM: 20170201213622

## 2015-12-12 NOTE — Progress Notes (Signed)
Carelink summary report received. Battery status OK. Normal device function. No new symptom episodes, tachy episodes, brady, or pause episodes. No new AF episodes. Monthly summary reports and ROV/PRN 

## 2015-12-15 LAB — CUP PACEART REMOTE DEVICE CHECK: Date Time Interrogation Session: 20170303220853

## 2015-12-15 NOTE — Progress Notes (Signed)
Carelink summary report received. Battery status OK. Normal device function. No new symptom episodes, tachy episodes, brady, or pause episodes. No new AF episodes. Monthly summary reports and ROV/PRN 

## 2016-01-02 ENCOUNTER — Encounter: Payer: Self-pay | Admitting: Internal Medicine

## 2016-01-04 ENCOUNTER — Encounter: Payer: Self-pay | Admitting: Internal Medicine

## 2016-01-08 ENCOUNTER — Ambulatory Visit (INDEPENDENT_AMBULATORY_CARE_PROVIDER_SITE_OTHER): Payer: Medicare Other | Admitting: *Deleted

## 2016-01-08 DIAGNOSIS — R55 Syncope and collapse: Secondary | ICD-10-CM | POA: Diagnosis not present

## 2016-01-08 DIAGNOSIS — R1310 Dysphagia, unspecified: Secondary | ICD-10-CM | POA: Diagnosis not present

## 2016-01-08 NOTE — Progress Notes (Signed)
Carelink Summary Report / Loop Recorder 

## 2016-01-09 ENCOUNTER — Encounter: Payer: Self-pay | Admitting: Internal Medicine

## 2016-01-09 ENCOUNTER — Ambulatory Visit (INDEPENDENT_AMBULATORY_CARE_PROVIDER_SITE_OTHER): Payer: Medicare Other | Admitting: Internal Medicine

## 2016-01-09 VITALS — BP 124/58 | HR 58 | Ht 69.0 in | Wt 162.0 lb

## 2016-01-09 DIAGNOSIS — R55 Syncope and collapse: Secondary | ICD-10-CM | POA: Diagnosis not present

## 2016-01-09 LAB — CBC WITH DIFFERENTIAL/PLATELET
BASOS PCT: 1 %
Basophils Absolute: 74 cells/uL (ref 0–200)
EOS ABS: 74 {cells}/uL (ref 15–500)
EOS PCT: 1 %
HCT: 41.8 % (ref 38.5–50.0)
HEMOGLOBIN: 14.4 g/dL (ref 13.2–17.1)
LYMPHS ABS: 3182 {cells}/uL (ref 850–3900)
Lymphocytes Relative: 43 %
MCH: 31 pg (ref 27.0–33.0)
MCHC: 34.4 g/dL (ref 32.0–36.0)
MCV: 90.1 fL (ref 80.0–100.0)
MONOS PCT: 8 %
MPV: 9.7 fL (ref 7.5–12.5)
Monocytes Absolute: 592 cells/uL (ref 200–950)
NEUTROS ABS: 3478 {cells}/uL (ref 1500–7800)
Neutrophils Relative %: 47 %
PLATELETS: 263 10*3/uL (ref 140–400)
RBC: 4.64 MIL/uL (ref 4.20–5.80)
RDW: 13.5 % (ref 11.0–15.0)
WBC: 7.4 10*3/uL (ref 3.8–10.8)

## 2016-01-09 LAB — CUP PACEART INCLINIC DEVICE CHECK: MDC IDC SESS DTM: 20170404160142

## 2016-01-09 LAB — BASIC METABOLIC PANEL
BUN: 20 mg/dL (ref 7–25)
CALCIUM: 8.7 mg/dL (ref 8.6–10.3)
CHLORIDE: 104 mmol/L (ref 98–110)
CO2: 26 mmol/L (ref 20–31)
CREATININE: 1.1 mg/dL (ref 0.70–1.11)
Glucose, Bld: 68 mg/dL (ref 65–99)
Potassium: 4.6 mmol/L (ref 3.5–5.3)
SODIUM: 140 mmol/L (ref 135–146)

## 2016-01-09 NOTE — Progress Notes (Signed)
HPI Mr. Dubay returns today for ongoing evaluation of syncope.  The patient has had longstanding baseline LBBB. He underwent ILR insertion 12 months ago. He has no remote history of syncope. He has been diagnosed with dementia. Since I saw him last, he has had a few dizzy spells and these appear to correlate with pauses during the daytime of up to 4 seconds. He did not seek medical attention. No additional symptoms. Allergies  Allergen Reactions  . Penicillins Other (See Comments)    Patient states that it makes him go "crazy"  . Codeine Other (See Comments)    Patient states that it makes him go "crazy"     Current Outpatient Prescriptions  Medication Sig Dispense Refill  . Ascorbic Acid (VITAMIN C) 1000 MG tablet Take 1,000 mg by mouth daily.    Marland Kitchen aspirin EC 81 MG tablet Take 81 mg by mouth at bedtime.     . cholecalciferol (VITAMIN D) 1000 UNITS tablet Take 1,000 Units by mouth daily.    . citalopram (CELEXA) 10 MG tablet Take 10 mg by mouth daily.    Marland Kitchen donepezil (ARICEPT) 10 MG tablet Take 10 mg by mouth daily.   1  . Multiple Vitamins-Minerals (EYE HEALTH) CAPS Take by mouth.    . Multiple Vitamins-Minerals (ICAPS PO) Take 1 capsule by mouth daily.    . pantoprazole (PROTONIX) 40 MG tablet Take 40 mg by mouth daily.  1  . traMADol (ULTRAM) 50 MG tablet Take 1 tablet by mouth every 6 (six) hours as needed (pain).   0   No current facility-administered medications for this visit.     Past Medical History  Diagnosis Date  . Prostate CA ([place])     radiation seeds  . Arthritis   . LBBB (left bundle branch block)   . Hx of echocardiogram     a. Echo (09/2011):  EF 55-60%, mild MR, mild AI, mild LAE, PASP 35;  b. Echo (10/25/2013): Mild LVH, EF 55-60%, normal wall motion, mild AI, mild MR, PASP 43  . Hx of cardiovascular stress test     Adenosine Myoview (09/2011):  No ischemia, EF 68%, low risk.  . Coronary artery disease     ROS:   All systems reviewed and negative  except as noted in the HPI.   Past Surgical History  Procedure Laterality Date  . Appendectomy    . Radioactive seed implant    . Root canal    . Tonsillectomy    . Lasik    . Cataract extraction    . Loop recorder implant N/A 01/12/2015    Procedure: LOOP RECORDER IMPLANT;  Surgeon: Evans Lance, MD;  Location: [facility] CATH LAB;  Service: Cardiovascular;  Laterality: N/A;     Family History  Problem Relation Age of Onset  . Heart attack Mother   . Heart attack Sister   . Lung cancer Brother   . Lung cancer Sister   . Other Father     sudden cardiac 64 yo  . Colon cancer Sister   . Cancer Sister   . Heart failure Brother      Social History   Social History  . Marital Status: Married    Spouse Name: N/A  . Number of Children: N/A  . Years of Education: N/A   Occupational History  . Not on file.   Social History Main Topics  . Smoking status: Never Smoker   . Smokeless tobacco: Not on file  .  Alcohol Use: No  . Drug Use: No  . Sexual Activity: Not on file   Other Topics Concern  . 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             BP 124/58 mmHg  Pulse 58  Ht 5\' 9"  (1.753 m)  Wt 162 lb (73.483 kg)  BMI 23.91 kg/m2  Physical Exam:  Well appearing elderly man, NAD HEENT: Unremarkable Neck:  6 cm JVD, no thyromegally Lymphatics:  No adenopathy Back:  No CVA tenderness Lungs:  Clear with no wheezes HEART:  Regular rate rhythm, no murmurs, no rubs, no clicks Abd:  soft, positive bowel sounds, no organomegally, no rebound, no guarding Ext:  2 plus pulses, no edema, no cyanosis, no clubbing Skin:  No rashes no nodules Neuro:  CN II through XII intact, motor grossly intact  EKG - NSR with LBBB  ILR interogation - several 3-4 second pauses occuring during the daytime.  Assess/Plan: 1. 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 I have recommended proceeding with PPM  insertion 2. HTN - his blood pressure is well controlled.  3. Dementia - this appears to be fairly mild. Will follow. 4. CAD - he denies anginal symptoms. Will follow.  [name].D.

## 2016-01-09 NOTE — Patient Instructions (Addendum)
Medication Instructions:  Your physician recommends that you continue on your current medications as directed. Please refer to the Current Medication list given to you today.   Labwork: Your physician recommends that you return for lab work today   Testing/Procedures: Your physician has recommended that you have a pacemaker inserted. A pacemaker is a small device that is placed under the skin of your chest or abdomen to help control abnormal heart rhythms. This device uses electrical pulses to prompt the heart to beat at a normal rate. Pacemakers are used to treat heart rhythms that are too slow. Wire (leads) are attached to the pacemaker that goes into the chambers of you heart. This is done in the hospital and usually requires and overnight stay. Please see the instruction sheet given to you today for more information.---01/11/16  Please arrive at the [facility] on 01/11/16 at 12:00.  Okay to have a light breakfast and take your morning medications.    Please plan for one night stay     Follow-Up:  Your physician wants you to follow-up in: 10-14 days from 4/6 in device clinic for wound check and 3 months with Dr Lovena Le  Any Other Special Instructions Will Be Listed Below (If Applicable).     If you need a refill on your cardiac medications before your next appointment, please call your pharmacy.

## 2016-01-11 ENCOUNTER — Ambulatory Visit (HOSPITAL_COMMUNITY)
Admission: RE | Admit: 2016-01-11 | Discharge: 2016-01-12 | Disposition: A | Discharge status: Home / Self Care | Payer: Medicare Other | Source: Ambulatory Visit | Attending: Internal Medicine | Admitting: Internal Medicine

## 2016-01-11 ENCOUNTER — Encounter (HOSPITAL_COMMUNITY)
Admission: RE | Disposition: A | Discharge status: Home / Self Care | Payer: Self-pay | Source: Ambulatory Visit | Attending: Internal Medicine

## 2016-01-11 DIAGNOSIS — R55 Syncope and collapse: Secondary | ICD-10-CM | POA: Diagnosis present

## 2016-01-11 DIAGNOSIS — I10 Essential (primary) hypertension: Secondary | ICD-10-CM | POA: Insufficient documentation

## 2016-01-11 DIAGNOSIS — C61 Malignant neoplasm of prostate: Secondary | ICD-10-CM | POA: Insufficient documentation

## 2016-01-11 DIAGNOSIS — Z950 Presence of cardiac pacemaker: Secondary | ICD-10-CM | POA: Diagnosis present

## 2016-01-11 DIAGNOSIS — R001 Bradycardia, unspecified: Secondary | ICD-10-CM | POA: Diagnosis not present

## 2016-01-11 DIAGNOSIS — I2510 Atherosclerotic heart disease of native coronary artery without angina pectoris: Secondary | ICD-10-CM | POA: Diagnosis not present

## 2016-01-11 DIAGNOSIS — I442 Atrioventricular block, complete: Secondary | ICD-10-CM

## 2016-01-11 DIAGNOSIS — Z923 Personal history of irradiation: Secondary | ICD-10-CM | POA: Diagnosis not present

## 2016-01-11 DIAGNOSIS — Z79899 Other long term (current) drug therapy: Secondary | ICD-10-CM | POA: Diagnosis not present

## 2016-01-11 DIAGNOSIS — I447 Left bundle-branch block, unspecified: Secondary | ICD-10-CM | POA: Diagnosis present

## 2016-01-11 DIAGNOSIS — F0390 Unspecified dementia without behavioral disturbance: Secondary | ICD-10-CM | POA: Diagnosis not present

## 2016-01-11 DIAGNOSIS — Z7982 Long term (current) use of aspirin: Secondary | ICD-10-CM | POA: Insufficient documentation

## 2016-01-11 HISTORY — PX: EP IMPLANTABLE DEVICE: SHX172B

## 2016-01-11 LAB — SURGICAL PCR SCREEN
MRSA, PCR: NEGATIVE
Staphylococcus aureus: NEGATIVE

## 2016-01-11 SURGERY — PACEMAKER IMPLANT
Anesthesia: LOCAL

## 2016-01-11 MED ORDER — MUPIROCIN 2 % EX OINT
TOPICAL_OINTMENT | Freq: Two times a day (BID) | CUTANEOUS | Status: DC
  Administered 2016-01-11: 1 via NASAL

## 2016-01-11 MED ORDER — HEPARIN (PORCINE) IN NACL 2-0.9 UNIT/ML-% IJ SOLN
INTRAMUSCULAR | Status: AC
  Filled 2016-01-11: qty 500

## 2016-01-11 MED ORDER — LIDOCAINE HCL (PF) 1 % IJ SOLN
INTRAMUSCULAR | Status: AC
  Filled 2016-01-11: qty 30

## 2016-01-11 MED ORDER — ONDANSETRON HCL 4 MG/2ML IJ SOLN: 4.0000 mg | Freq: Four times a day (QID) | INTRAMUSCULAR | Status: DC | PRN

## 2016-01-11 MED ORDER — VITAMIN C 500 MG PO TABS
1000.0000 mg | ORAL_TABLET | Freq: Every day | ORAL | Status: DC
  Administered 2016-01-12: 1000 mg via ORAL
  Filled 2016-01-11: qty 2

## 2016-01-11 MED ORDER — DONEPEZIL HCL 10 MG PO TABS
10.0000 mg | ORAL_TABLET | Freq: Every day | ORAL | Status: DC
  Administered 2016-01-12: 10 mg via ORAL
  Filled 2016-01-11: qty 1

## 2016-01-11 MED ORDER — MIDAZOLAM HCL 5 MG/5ML IJ SOLN
INTRAMUSCULAR | Status: AC
  Filled 2016-01-11: qty 5

## 2016-01-11 MED ORDER — LIDOCAINE HCL (PF) 1 % IJ SOLN
INTRAMUSCULAR | Status: AC
  Filled 2016-01-11: qty 60

## 2016-01-11 MED ORDER — CITALOPRAM HYDROBROMIDE 10 MG PO TABS
10.0000 mg | ORAL_TABLET | Freq: Every day | ORAL | Status: DC
  Administered 2016-01-12: 10 mg via ORAL
  Filled 2016-01-11 (×2): qty 1

## 2016-01-11 MED ORDER — VANCOMYCIN HCL IN DEXTROSE 1-5 GM/200ML-% IV SOLN
1000.0000 mg | Freq: Two times a day (BID) | INTRAVENOUS | Status: AC
  Administered 2016-01-12: 1000 mg via INTRAVENOUS
  Filled 2016-01-11: qty 200

## 2016-01-11 MED ORDER — VITAMIN D 1000 UNITS PO TABS
1000.0000 [IU] | ORAL_TABLET | Freq: Every day | ORAL | Status: DC
  Administered 2016-01-12: 1000 [IU] via ORAL
  Filled 2016-01-11: qty 1

## 2016-01-11 MED ORDER — DIPHENHYDRAMINE HCL 25 MG PO CAPS
25.0000 mg | ORAL_CAPSULE | Freq: Every day | ORAL | Status: DC
  Administered 2016-01-12: 25 mg via ORAL
  Filled 2016-01-11: qty 1

## 2016-01-11 MED ORDER — ASPIRIN EC 81 MG PO TBEC
81.0000 mg | DELAYED_RELEASE_TABLET | Freq: Every day | ORAL | Status: DC
  Administered 2016-01-11: 81 mg via ORAL
  Filled 2016-01-11: qty 1

## 2016-01-11 MED ORDER — VANCOMYCIN HCL IN DEXTROSE 1-5 GM/200ML-% IV SOLN
INTRAVENOUS | Status: AC
  Filled 2016-01-11: qty 200

## 2016-01-11 MED ORDER — PANTOPRAZOLE SODIUM 40 MG PO TBEC
40.0000 mg | DELAYED_RELEASE_TABLET | Freq: Every day | ORAL | Status: DC
  Administered 2016-01-12: 40 mg via ORAL
  Filled 2016-01-11: qty 1

## 2016-01-11 MED ORDER — MUPIROCIN 2 % EX OINT
TOPICAL_OINTMENT | CUTANEOUS | Status: AC
  Administered 2016-01-11: 1 via NASAL
  Filled 2016-01-11: qty 22

## 2016-01-11 MED ORDER — SODIUM CHLORIDE 0.9 % IV SOLN
INTRAVENOUS | Status: DC
  Administered 2016-01-11: 13:00:00 via INTRAVENOUS

## 2016-01-11 MED ORDER — MIDAZOLAM HCL 5 MG/5ML IJ SOLN
INTRAMUSCULAR | Status: DC | PRN
  Administered 2016-01-11 (×3): 1 mg via INTRAVENOUS

## 2016-01-11 MED ORDER — SODIUM CHLORIDE 0.9 % IR SOLN
Status: AC
  Filled 2016-01-11: qty 2

## 2016-01-11 MED ORDER — CHLORHEXIDINE GLUCONATE 4 % EX LIQD: 60.0000 mL | Freq: Once | CUTANEOUS | Status: DC

## 2016-01-11 MED ORDER — VANCOMYCIN HCL 1000 MG IV SOLR
1000.0000 mg | INTRAVENOUS | Status: DC | PRN
  Administered 2016-01-11: 1000 mg via INTRAVENOUS

## 2016-01-11 MED ORDER — GENTAMICIN SULFATE 40 MG/ML IJ SOLN: 80.0000 mg | INTRAMUSCULAR | Status: DC

## 2016-01-11 MED ORDER — FENTANYL CITRATE (PF) 100 MCG/2ML IJ SOLN
INTRAMUSCULAR | Status: DC | PRN
  Administered 2016-01-11 (×3): 12.5 ug via INTRAVENOUS

## 2016-01-11 MED ORDER — VANCOMYCIN HCL IN DEXTROSE 1-5 GM/200ML-% IV SOLN: 1000.0000 mg | INTRAVENOUS | Status: DC

## 2016-01-11 MED ORDER — ACETAMINOPHEN 325 MG PO TABS
325.0000 mg | ORAL_TABLET | ORAL | Status: DC | PRN
  Administered 2016-01-12: 650 mg via ORAL
  Filled 2016-01-11: qty 2

## 2016-01-11 MED ORDER — FENTANYL CITRATE (PF) 100 MCG/2ML IJ SOLN
INTRAMUSCULAR | Status: AC
  Filled 2016-01-11: qty 2

## 2016-01-11 SURGICAL SUPPLY — 7 items
CABLE SURGICAL S-101-97-12 (CABLE) ×1 IMPLANT
LEAD SOLIA S PRO MRI 45 (Lead) ×1 IMPLANT
LEAD SOLIA S PRO MRI 53 (Lead) ×1 IMPLANT
PAD DEFIB LIFELINK (PAD) ×1 IMPLANT
PPM ELUNA DR-T 394969 (Pacemaker) ×1 IMPLANT
SHEATH CLASSIC 7F (SHEATH) ×2 IMPLANT
TRAY PACEMAKER INSERTION (PACKS) ×1 IMPLANT

## 2016-01-11 NOTE — Discharge Summary (Signed)
ELECTROPHYSIOLOGY PROCEDURE DISCHARGE SUMMARY    Patient ID: Raymond Hinton,  MRN: [id number], DOB/AGE: 80/02/1927 80 y.o.  Admit date: 01/11/2016 Discharge date: 01/12/2016  Primary Care Physician: Donnie Coffin Electrophysiologist: Lovena Le  Primary Discharge Diagnosis:  Symptomatic intermittent complete heart block status post pacemaker implantation this admission  Secondary Discharge Diagnosis:  1.  Dementia 2.  Hypertension 3.  CAD 4.  Prostate cancer s/p radiation   Allergies  Allergen Reactions  . Penicillins Other (See Comments)    Patient states that it makes him go "crazy" Has patient had a PCN reaction causing immediate rash, facial/tongue/throat swelling, SOB or lightheadedness with hypotension: Yes Has patient had a PCN reaction causing severe rash involving mucus membranes or skin necrosis: No Has patient had a PCN reaction that required hospitalization No Has patient had a PCN reaction occurring within the last 10 years: No If all of the above answers are "NO", then may proceed with Cephalosporin use.   . Codeine Other (See Comments)    Patient states that it makes him go "crazy"     Procedures This Admission:  1.  Implantation of a Biotronik dual chamber PPM on 01/11/16 by Dr Lovena Le. See op note for full details. There were no immediate post procedure complications. Previously implanted MDT LINQ was also removed.  2.  CXR on 01/12/16 demonstrated no pneumothorax status post device implantation.   Brief HPI: Raymond Hinton is a 82 y.o. male with a past medical history as outlined above.  He previously underwent ILR implant for evaluation of syncope which has demonstrated intermittent complete heart block.  The patient has had symptomatic bradycardia without reversible causes identified.  Risks, benefits, and alternatives to PPM implantation were reviewed with the patient who wished to proceed.   Hospital Course:  The patient was admitted and underwent  implantation of a Biotronik dual chamber pacemaker with details as outlined above.  He  was monitored on telemetry overnight which demonstrated sinus rhythm.  Left chest was without hematoma or ecchymosis.  The device was interrogated and found to be functioning normally.  CXR was obtained and demonstrated no pneumothorax status post device implantation.  Wound care, arm mobility, and restrictions were reviewed with the patient.  The patient was examined and considered stable for discharge to home.    Physical Exam: Filed Vitals:   01/11/16 1900 01/11/16 2134 01/12/16 0000 01/12/16 0400  BP: 141/78 137/70 136/71 140/72  Pulse: 65 76    Temp:  98.7 F (37.1 C)    TempSrc:  Oral    Resp: 21 18 18 17   Height:      Weight:      SpO2: 96% 99%      GEN- The patient is well appearing, alert and oriented x 3 today.   HEENT: normocephalic, atraumatic; sclera clear, conjunctiva pink; hearing intact; oropharynx clear; neck supple  Lungs- Clear to ausculation bilaterally, normal work of breathing.  No wheezes, rales, rhonchi Heart- Regular rate and rhythm, no murmurs, rubs or gallops  GI- soft, non-tender, non-distended, bowel sounds present  Extremities- no clubbing, cyanosis, or edema; DP/PT/radial pulses 2+ bilaterally MS- no significant deformity or atrophy Skin- warm and dry, no rash or lesion, left chest without hematoma/ecchymosis Psych- euthymic mood, full affect Neuro- strength and sensation are intact   Labs:   Lab Results  Component Value Date   WBC 7.4 01/09/2016   HGB 14.4 01/09/2016   HCT 41.8 01/09/2016   MCV 90.1 01/09/2016   PLT 263 01/09/2016  Recent Labs Lab 01/09/16 1256  NA 140  K 4.6  CL 104  CO2 26  BUN 20  CREATININE 1.10  CALCIUM 8.7  GLUCOSE 68    Discharge Medications:    Medication List    TAKE these medications        aspirin EC 81 MG tablet  Take 81 mg by mouth at bedtime.     cholecalciferol 1000 units tablet  Commonly known as:   VITAMIN D  Take 1,000 Units by mouth daily.     citalopram 10 MG tablet  Commonly known as:  CELEXA  Take 10 mg by mouth daily.     diphenhydrAMINE 25 mg capsule  Commonly known as:  BENADRYL  Take 25 mg by mouth daily.     donepezil 10 MG tablet  Commonly known as:  ARICEPT  Take 10 mg by mouth daily.     ICAPS PO  Take 1 capsule by mouth daily.     pantoprazole 40 MG tablet  Commonly known as:  PROTONIX  Take 40 mg by mouth daily.     vitamin C 1000 MG tablet  Take 1,000 mg by mouth daily.        Disposition:  Discharge Instructions    Diet - low sodium heart healthy    Complete by:  As directed      Increase activity slowly    Complete by:  As directed           Follow-up Information    Follow up with [facility] On 01/25/2016.   Specialty:  Cardiology   Why:  at 11:30AM for wound check    Contact information:   [address] [place] [phone]      Follow up with Cristopher Peru, MD On 04/11/2016.   Specialty:  Cardiology   Why:  at 9:15AM    Contact information:   [place] [place] [address] [phone]       Duration of Discharge Encounter: Greater than 30 minutes including physician time.  Signed, Chanetta Marshall, NP 01/12/2016 7:07 AM   EP Attending  Patient seen and examined. Agree with the findings as noted above. His DDD PM has been interogated under my direction and is working normally. Stable for DC and usual followup.  [name].D.

## 2016-01-11 NOTE — Interval H&P Note (Signed)
History and Physical Interval Note:  01/11/2016 12:23 PM  Raymond Hinton  has presented today for surgery, with the diagnosis of Syncope/Bradycardia  The various methods of treatment have been discussed with the patient and family. After consideration of risks, benefits and other options for treatment, the patient has consented to  Procedure(s): Pacemaker Implant (N/A) as a surgical intervention .  The patient's history has been reviewed, patient examined, no change in status, stable for surgery.  I have reviewed the patient's chart and labs.  Questions were answered to the patient's satisfaction.     Cristopher Peru

## 2016-01-11 NOTE — H&P (View-Only) (Signed)
HPI Raymond Hinton returns today for ongoing evaluation of syncope.  The patient has had longstanding baseline LBBB. He underwent ILR insertion 12 months ago. He has no remote history of syncope. He has been diagnosed with dementia. Since I saw him last, he has had a few dizzy spells and these appear to correlate with pauses during the daytime of up to 4 seconds. He did not seek medical attention. No additional symptoms. Allergies  Allergen Reactions  . Penicillins Other (See Comments)    Patient states that it makes him go "crazy"  . Codeine Other (See Comments)    Patient states that it makes him go "crazy"     Current Outpatient Prescriptions  Medication Sig Dispense Refill  . Ascorbic Acid (VITAMIN C) 1000 MG tablet Take 1,000 mg by mouth daily.    Marland Kitchen aspirin EC 81 MG tablet Take 81 mg by mouth at bedtime.     . cholecalciferol (VITAMIN D) 1000 UNITS tablet Take 1,000 Units by mouth daily.    . citalopram (CELEXA) 10 MG tablet Take 10 mg by mouth daily.    Marland Kitchen donepezil (ARICEPT) 10 MG tablet Take 10 mg by mouth daily.   1  . Multiple Vitamins-Minerals (EYE HEALTH) CAPS Take by mouth.    . Multiple Vitamins-Minerals (ICAPS PO) Take 1 capsule by mouth daily.    . pantoprazole (PROTONIX) 40 MG tablet Take 40 mg by mouth daily.  1  . traMADol (ULTRAM) 50 MG tablet Take 1 tablet by mouth every 6 (six) hours as needed (pain).   0   No current facility-administered medications for this visit.     Past Medical History  Diagnosis Date  . Prostate CA ([place])     radiation seeds  . Arthritis   . LBBB (left bundle branch block)   . Hx of echocardiogram     a. Echo (09/2011):  EF 55-60%, mild MR, mild AI, mild LAE, PASP 35;  b. Echo (10/25/2013): Mild LVH, EF 55-60%, normal wall motion, mild AI, mild MR, PASP 43  . Hx of cardiovascular stress test     Adenosine Myoview (09/2011):  No ischemia, EF 68%, low risk.  . Coronary artery disease     ROS:   All systems reviewed and negative  except as noted in the HPI.   Past Surgical History  Procedure Laterality Date  . Appendectomy    . Radioactive seed implant    . Root canal    . Tonsillectomy    . Lasik    . Cataract extraction    . Loop recorder implant N/A 01/12/2015    Procedure: LOOP RECORDER IMPLANT;  Surgeon: Evans Lance, MD;  Location: [facility] C[facility] LAB;  Service: Cardiovascular;  Laterality: N/A;     Family History  Problem Relation Age of Onset  . Heart attack Mother   . Heart attack Sister   . Lung cancer Brother   . Lung cancer Sister   . Other Father     sudden cardiac 23 yo  . Colon cancer Sister   . Cancer Sister   . Heart failure Brother      Social History   Social History  . Marital Status: Married    Spouse Name: N/A  . Number of Children: N/A  . Years of Education: N/A   Occupational History  . Not on file.   Social History Main Topics  . Smoking status: Never Smoker   . Smokeless tobacco: Not on file  .  Alcohol Use: No  . Drug Use: No  . Sexual Activity: Not on file   Other Topics Concern  . 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             BP 124/58 mmHg  Pulse 58  Ht 5\' 9"  (1.753 m)  Wt 162 lb (73.483 kg)  BMI 23.91 kg/m2  Physical Exam:  Well appearing elderly man, NAD HEENT: Unremarkable Neck:  6 cm JVD, no thyromegally Lymphatics:  No adenopathy Back:  No CVA tenderness Lungs:  Clear with no wheezes HEART:  Regular rate rhythm, no murmurs, no rubs, no clicks Abd:  soft, positive bowel sounds, no organomegally, no rebound, no guarding Ext:  2 plus pulses, no edema, no cyanosis, no clubbing Skin:  No rashes no nodules Neuro:  CN II through XII intact, motor grossly intact  EKG - NSR with LBBB  ILR interogation - several 3-4 second pauses occuring during the daytime.  Assess/Plan: 1. 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 I have recommended proceeding with PPM  insertion 2. HTN - his blood pressure is well controlled.  3. Dementia - this appears to be fairly mild. Will follow. 4. CAD - he denies anginal symptoms. Will follow.  [name].D.

## 2016-01-11 NOTE — Discharge Instructions (Signed)
° ° °  Supplemental Discharge Instructions for  Pacemaker/Defibrillator Patients  Activity No heavy lifting or vigorous activity with your left/right arm for 6 to 8 weeks.  Do not raise your left/right arm above your head for one week.  Gradually raise your affected arm as drawn below.           __      01/15/16                    01/16/16                   01/17/16                01/18/16   WOUND CARE - Keep the wound area clean and dry.  Do not get this area wet for one week. No showers for one week; you may shower on   01/18/16  . - The tape/steri-strips on your wound will fall off; do not pull them off.  No bandage is needed on the site.  DO  NOT apply any creams, oils, or ointments to the wound area. - If you notice any drainage or discharge from the wound, any swelling or bruising at the site, or you develop a fever > 101? F after you are discharged home, call the office at once.  Special Instructions - You are still able to use cellular telephones; use the ear opposite the side where you have your pacemaker/defibrillator.  Avoid carrying your cellular phone near your device. - When traveling through airports, show security personnel your identification card to avoid being screened in the metal detectors.  Ask the security personnel to use the hand wand. - Avoid arc welding equipment, MRI testing (magnetic resonance imaging), TENS units (transcutaneous nerve stimulators).  Call the office for questions about other devices. - Avoid electrical appliances that are in poor condition or are not properly grounded. - Microwave ovens are safe to be near or to operate.

## 2016-01-12 ENCOUNTER — Ambulatory Visit (HOSPITAL_COMMUNITY): Payer: Medicare Other

## 2016-01-12 ENCOUNTER — Telehealth: Payer: Self-pay | Admitting: *Deleted

## 2016-01-12 ENCOUNTER — Telehealth: Payer: Self-pay | Admitting: Internal Medicine

## 2016-01-12 ENCOUNTER — Encounter (HOSPITAL_COMMUNITY): Payer: Self-pay | Admitting: *Deleted

## 2016-01-12 DIAGNOSIS — Z923 Personal history of irradiation: Secondary | ICD-10-CM | POA: Diagnosis not present

## 2016-01-12 DIAGNOSIS — Z45018 Encounter for adjustment and management of other part of cardiac pacemaker: Secondary | ICD-10-CM | POA: Diagnosis not present

## 2016-01-12 DIAGNOSIS — I2510 Atherosclerotic heart disease of native coronary artery without angina pectoris: Secondary | ICD-10-CM | POA: Diagnosis not present

## 2016-01-12 DIAGNOSIS — I10 Essential (primary) hypertension: Secondary | ICD-10-CM | POA: Diagnosis not present

## 2016-01-12 DIAGNOSIS — F0390 Unspecified dementia without behavioral disturbance: Secondary | ICD-10-CM | POA: Diagnosis not present

## 2016-01-12 DIAGNOSIS — R001 Bradycardia, unspecified: Secondary | ICD-10-CM | POA: Diagnosis not present

## 2016-01-12 DIAGNOSIS — I442 Atrioventricular block, complete: Secondary | ICD-10-CM

## 2016-01-12 DIAGNOSIS — Z950 Presence of cardiac pacemaker: Secondary | ICD-10-CM

## 2016-01-12 MED FILL — Lidocaine HCl Local Preservative Free (PF) Inj 1%: INTRAMUSCULAR | Qty: 30 | Status: AC

## 2016-01-12 MED FILL — Gentamicin Sulfate Inj 40 MG/ML: INTRAMUSCULAR | Qty: 2 | Status: AC

## 2016-01-12 MED FILL — Lidocaine HCl Local Preservative Free (PF) Inj 1%: INTRAMUSCULAR | Qty: 60 | Status: AC

## 2016-01-12 MED FILL — Sodium Chloride Irrigation Soln 0.9%: Qty: 500 | Status: AC

## 2016-01-12 MED FILL — Heparin Sodium (Porcine) 2 Unit/ML in Sodium Chloride 0.9%: INTRAMUSCULAR | Qty: 500 | Status: AC

## 2016-01-12 NOTE — Telephone Encounter (Signed)
Error

## 2016-01-12 NOTE — Progress Notes (Signed)
Report received via Josh RN in patient's room using SBAR format, reviewed orders, lab, VS, meds and patient's general condition, assumed care of patient

## 2016-01-12 NOTE — Plan of Care (Signed)
Problem: Phase II Progression Outcomes Goal: Pain controlled Outcome: Progressing Patient c/o some mild left shoulder discomfort and was medicated with some Tylenol, will continue to monitor. Patient instructed to keep his arm in the sling, not to sleep on his left side and not to put arm above his head and patient verbalized understanding, will give him some printed information on pacemaker insertion site care.

## 2016-01-12 NOTE — Plan of Care (Signed)
Problem: Phase II Progression Outcomes Goal: Hemodynamically stable Outcome: Progressing VSS with minimal pain noted, will continue to monitor. Left subclavian site with dressing remains C,D,I with no s/s of complciations

## 2016-01-12 NOTE — Telephone Encounter (Signed)
Spoke with patient's wife and let her know that pain medication is not usually prescribed for patients post procedure.  He can take Ibuprofen alternating with Tylenol.  She understood and was apprecitiave of my call.

## 2016-01-12 NOTE — Plan of Care (Signed)
Problem: Phase II Progression Outcomes Goal: Pacer site without bleeding/hematoma Outcome: Progressing Left subclavian site C,D,I with a dressing covering insertion site with no s/s of complications, VSS, will continue to monitor

## 2016-01-12 NOTE — Telephone Encounter (Signed)
Patients wife called and stated that the patient is experiencing some pain from the procedure and would like to know if Dr Lovena Le would prescribe some pain medication for him. She also states that the hospital called to ask if they wanted the old device that he had removed and she would like to know if they need to pick it up or return it to the company. Please advise. Thanks, MI

## 2016-01-12 NOTE — Telephone Encounter (Signed)
Follow up   Marissa called and stated that the patient is experiencing some pain from the procedure   and would like to know if Dr Lovena Le would prescribe some pain medication for him.

## 2016-01-12 NOTE — Plan of Care (Signed)
Problem: Phase II Progression Outcomes Goal: No evidence of pacemaker malfunction Outcome: Progressing Patient has been in a NSR all night and I haven't seen any pacer spikes at this time, will continue to monitor, VSS with no s/s of complications

## 2016-01-12 NOTE — Progress Notes (Signed)
Patient back from CXR and cardiology in to interogate his new pacemaker, update given to Adrian, reviewed VS, and patient's general condition, transferred care to him.

## 2016-01-25 ENCOUNTER — Encounter: Payer: Self-pay | Admitting: Internal Medicine

## 2016-01-25 ENCOUNTER — Ambulatory Visit (HOSPITAL_COMMUNITY)
Admission: RE | Admit: 2016-01-25 | Discharge: 2016-01-25 | Disposition: A | Discharge status: Home / Self Care | Payer: Medicare Other | Source: Ambulatory Visit | Attending: Internal Medicine | Admitting: Internal Medicine

## 2016-01-25 ENCOUNTER — Ambulatory Visit: Payer: Medicare Other

## 2016-01-25 ENCOUNTER — Ambulatory Visit (INDEPENDENT_AMBULATORY_CARE_PROVIDER_SITE_OTHER): Payer: Medicare Other | Admitting: *Deleted

## 2016-01-25 DIAGNOSIS — Z950 Presence of cardiac pacemaker: Secondary | ICD-10-CM | POA: Insufficient documentation

## 2016-01-25 DIAGNOSIS — I447 Left bundle-branch block, unspecified: Secondary | ICD-10-CM | POA: Diagnosis not present

## 2016-01-25 DIAGNOSIS — R55 Syncope and collapse: Secondary | ICD-10-CM | POA: Diagnosis not present

## 2016-01-25 DIAGNOSIS — Z45018 Encounter for adjustment and management of other part of cardiac pacemaker: Secondary | ICD-10-CM | POA: Diagnosis not present

## 2016-01-26 LAB — CUP PACEART INCLINIC DEVICE CHECK
Battery Remaining Percentage: 100 %
Implantable Lead Implant Date: 20170406
Implantable Lead Location: 753859
Implantable Lead Location: 753860
Implantable Lead Model: 377
Implantable Lead Serial Number: 49386458
Lead Channel Impedance Value: 468 Ohm
Lead Channel Pacing Threshold Amplitude: 1 V
Lead Channel Pacing Threshold Amplitude: 1 V
Lead Channel Pacing Threshold Amplitude: 1.4 V
Lead Channel Pacing Threshold Amplitude: 1.4 V
Lead Channel Pacing Threshold Pulse Width: 0.4 ms
Lead Channel Pacing Threshold Pulse Width: 0.4 ms
Lead Channel Pacing Threshold Pulse Width: 0.4 ms
Lead Channel Sensing Intrinsic Amplitude: 0.7 mV
Lead Channel Sensing Intrinsic Amplitude: 10.6 mV
Lead Channel Sensing Intrinsic Amplitude: 10.8 mV
Lead Channel Setting Pacing Amplitude: 3 V
MDC IDC LEAD IMPLANT DT: 20170406
MDC IDC LEAD SERIAL: 49394856
MDC IDC MSMT BATTERY REMAINING LONGEVITY: 114 mo
MDC IDC MSMT LEADCHNL RA PACING THRESHOLD AMPLITUDE: 1 V
MDC IDC MSMT LEADCHNL RA PACING THRESHOLD PULSEWIDTH: 0.4 ms
MDC IDC MSMT LEADCHNL RV IMPEDANCE VALUE: 565 Ohm
MDC IDC MSMT LEADCHNL RV PACING THRESHOLD AMPLITUDE: 1.4 V
MDC IDC MSMT LEADCHNL RV PACING THRESHOLD PULSEWIDTH: 0.4 ms
MDC IDC MSMT LEADCHNL RV PACING THRESHOLD PULSEWIDTH: 0.4 ms
MDC IDC SESS DTM: 20170420143300
MDC IDC SET LEADCHNL RA PACING AMPLITUDE: 3 V
MDC IDC SET LEADCHNL RV PACING PULSEWIDTH: 0.4 ms
Pulse Gen Serial Number: 68723375

## 2016-01-26 NOTE — Progress Notes (Signed)
Wound check appointment. Steri-strips removed. Wound without redness or edema. Incision edges approximated, wound well healed. Normal device function. Thresholds, sensing, and impedances consistent with implant measurements. Decrease in P wave measurement noted--now 0.47mV---cxr ordered. Device programmed at 3.0V for extra safety margin until 3 month visit. Histogram distribution appropriate for patient and level of activity. No mode switches or high ventricular rates noted. Patient educated about wound care, arm mobility, lifting restrictions. ROV in 3 months with GT.

## 2016-01-29 DIAGNOSIS — Z961 Presence of intraocular lens: Secondary | ICD-10-CM | POA: Diagnosis not present

## 2016-01-29 DIAGNOSIS — H21301 Idiopathic cysts of iris, ciliary body or anterior chamber, right eye: Secondary | ICD-10-CM | POA: Diagnosis not present

## 2016-02-02 ENCOUNTER — Telehealth: Payer: Self-pay | Admitting: Cardiology

## 2016-02-02 NOTE — Telephone Encounter (Signed)
Spoke w/ pt and requested that he send a manual transmission b/c his home monitor has not updated in at least 14 days.   

## 2016-02-07 ENCOUNTER — Telehealth: Payer: Self-pay | Admitting: Internal Medicine

## 2016-02-07 NOTE — Telephone Encounter (Signed)
Spoke with Mr. And Mrs. Abresch. He has had residual soreness at device site since PPM implant 01/11/16. They deny redness, swelling or drainage at site, fever and chills. They report that he has very little fatty tissue near device and that the device is rather prominent. I have advised them to manage the pain with OTC medications and to call us if the site becomes red, swollen or develops drainage. I offered an appt with device clinic if they felt like they needed to be seen due to the soreness around PPM- they declined at this time and have decided to wait.

## 2016-02-07 NOTE — Telephone Encounter (Signed)
New Message:  Pt's is calling in stating that the pt had a pace maker place 3 wks ago and the site is still sore. She wants to know if that is normal. Please f/u with her.

## 2016-03-02 LAB — CUP PACEART REMOTE DEVICE CHECK: Date Time Interrogation Session: 20170402220754

## 2016-03-02 NOTE — Progress Notes (Signed)
[facility] summary report received. Battery status OK. Normal device function. No new symptom episodes, tachy episodes, brady episodes. No new AF episodes. 2 pause episodes, pt now with PPM. Monthly summary reports and ROV/PRN

## 2016-03-18 DIAGNOSIS — K219 Gastro-esophageal reflux disease without esophagitis: Secondary | ICD-10-CM | POA: Diagnosis not present

## 2016-03-18 DIAGNOSIS — F320 Major depressive disorder, single episode, mild: Secondary | ICD-10-CM | POA: Diagnosis not present

## 2016-03-18 DIAGNOSIS — R413 Other amnesia: Secondary | ICD-10-CM | POA: Diagnosis not present

## 2016-03-18 DIAGNOSIS — M150 Primary generalized (osteo)arthritis: Secondary | ICD-10-CM | POA: Diagnosis not present

## 2016-03-28 ENCOUNTER — Encounter: Payer: Self-pay | Admitting: *Deleted

## 2016-04-11 ENCOUNTER — Encounter: Payer: Self-pay | Admitting: Internal Medicine

## 2016-04-11 ENCOUNTER — Ambulatory Visit (INDEPENDENT_AMBULATORY_CARE_PROVIDER_SITE_OTHER): Payer: Medicare Other | Admitting: Internal Medicine

## 2016-04-11 VITALS — BP 132/70 | HR 69 | Ht 69.5 in | Wt 161.6 lb

## 2016-04-11 DIAGNOSIS — R55 Syncope and collapse: Secondary | ICD-10-CM | POA: Diagnosis not present

## 2016-04-11 DIAGNOSIS — I442 Atrioventricular block, complete: Secondary | ICD-10-CM | POA: Diagnosis not present

## 2016-04-11 DIAGNOSIS — I447 Left bundle-branch block, unspecified: Secondary | ICD-10-CM | POA: Diagnosis not present

## 2016-04-11 LAB — CUP PACEART INCLINIC DEVICE CHECK
Battery Remaining Percentage: 100 %
Date Time Interrogation Session: 20170706123248
Implantable Lead Implant Date: 20170406
Implantable Lead Location: 753859
Implantable Lead Model: 377
Implantable Lead Model: 377
Implantable Lead Serial Number: 49386458
Lead Channel Impedance Value: 546 Ohm
Lead Channel Pacing Threshold Amplitude: 1 V
Lead Channel Pacing Threshold Pulse Width: 0.4 ms
Lead Channel Pacing Threshold Pulse Width: 0.4 ms
Lead Channel Sensing Intrinsic Amplitude: 0.7 mV
Lead Channel Sensing Intrinsic Amplitude: 12.3 mV
Lead Channel Setting Pacing Amplitude: 2.4 V
Lead Channel Setting Pacing Pulse Width: 0.4 ms
MDC IDC LEAD IMPLANT DT: 20170406
MDC IDC LEAD LOCATION: 753860
MDC IDC LEAD SERIAL: 49394856
MDC IDC MSMT BATTERY REMAINING LONGEVITY: 121 mo
MDC IDC MSMT LEADCHNL RA PACING THRESHOLD AMPLITUDE: 1 V
MDC IDC MSMT LEADCHNL RA PACING THRESHOLD PULSEWIDTH: 0.4 ms
MDC IDC MSMT LEADCHNL RA PACING THRESHOLD PULSEWIDTH: 0.4 ms
MDC IDC MSMT LEADCHNL RA SENSING INTR AMPL: 0.7 mV
MDC IDC MSMT LEADCHNL RV IMPEDANCE VALUE: 604 Ohm
MDC IDC MSMT LEADCHNL RV PACING THRESHOLD AMPLITUDE: 1 V
MDC IDC MSMT LEADCHNL RV PACING THRESHOLD AMPLITUDE: 1 V
MDC IDC MSMT LEADCHNL RV PACING THRESHOLD AMPLITUDE: 1.1 V
MDC IDC MSMT LEADCHNL RV PACING THRESHOLD PULSEWIDTH: 0.4 ms
MDC IDC MSMT LEADCHNL RV SENSING INTR AMPL: 12 mV
MDC IDC SET LEADCHNL RA PACING AMPLITUDE: 2 V
MDC IDC STAT BRADY RA PERCENT PACED: 33 %
MDC IDC STAT BRADY RV PERCENT PACED: 0 %
Pulse Gen Model: 394969
Pulse Gen Serial Number: 68723375

## 2016-04-11 NOTE — Patient Instructions (Addendum)
Your physician recommends that you continue on your current medications as directed. Please refer to the Current Medication list given to you today.  Your physician wants you to follow-up in: device clinic in 6 months.   You will receive a reminder letter in the mail two months in advance. If you don't receive a letter, please call our office to schedule the follow-up appointment.  Your physician wants you to follow-up in: 9 months with Dr. Lovena Le.  You will receive a reminder letter in the mail two months in advance. If you don't receive a letter, please call our office to schedule the follow-up appointment.

## 2016-04-11 NOTE — Progress Notes (Signed)
HPI [name] returns today for ongoing PPM followup.  The patient has had longstanding baseline LBBB. He underwent ILR insertion 15 months ago. He has no remote history of syncope. He has been diagnosed with dementia. He was found to have dizzy spells associated with daytime 4 second pauses. He underwent PPM insertion several months ago and has had no recurrent syncope.  Allergies  Allergen Reactions  . Penicillins Other (See Comments)    Patient states that it makes him go "crazy" Has patient had a PCN reaction causing immediate rash, facial/tongue/throat swelling, SOB or lightheadedness with hypotension: Yes Has patient had a PCN reaction causing severe rash involving mucus membranes or skin necrosis: No Has patient had a PCN reaction that required hospitalization No Has patient had a PCN reaction occurring within the last 10 years: No If all of the above answers are "NO", then may proceed with Cephalosporin use.   . Codeine Other (See Comments)    Patient states that it makes him go "crazy"     Current Outpatient Prescriptions  Medication Sig Dispense Refill  . Ascorbic Acid (VITAMIN C) 1000 MG tablet Take 1,000 mg by mouth daily.    Marland Kitchen aspirin EC 81 MG tablet Take 81 mg by mouth at bedtime.     . cholecalciferol (VITAMIN D) 1000 UNITS tablet Take 1,000 Units by mouth daily.    . citalopram (CELEXA) 10 MG tablet Take 10 mg by mouth daily.    . diphenhydrAMINE (BENADRYL) 25 mg capsule Take 25 mg by mouth daily.    Marland Kitchen donepezil (ARICEPT) 10 MG tablet Take 10 mg by mouth daily.   1  . Multiple Vitamins-Minerals (ICAPS PO) Take 1 capsule by mouth daily.    . pantoprazole (PROTONIX) 40 MG tablet Take 40 mg by mouth daily.  1   No current facility-administered medications for this visit.     Past Medical History  Diagnosis Date  . Prostate CA ([place])     radiation seeds  . Arthritis   . LBBB (left bundle branch block)   . Hx of echocardiogram     a. Echo (09/2011):  EF  55-60%, mild MR, mild AI, mild LAE, PASP 35;  b. Echo (10/25/2013): Mild LVH, EF 55-60%, normal wall motion, mild AI, mild MR, PASP 43  . Hx of cardiovascular stress test     Adenosine Myoview (09/2011):  No ischemia, EF 68%, low risk.  . Coronary artery disease     ROS:   All systems reviewed and negative except as noted in the HPI.   Past Surgical History  Procedure Laterality Date  . Appendectomy    . Radioactive seed implant    . Root canal    . Tonsillectomy    . Lasik    . Cataract extraction    . Loop recorder implant N/A 01/12/2015    Procedure: LOOP RECORDER IMPLANT;  Surgeon: Evans Lance, MD;  Location: [facility] CATH LAB;  Service: Cardiovascular;  Laterality: N/A;  . Ep implantable device N/A 01/11/2016    Procedure: Pacemaker Implant;  Surgeon: Evans Lance, MD;  Location: [place] CV LAB;  Service: Cardiovascular;  Laterality: N/A;  . Ep implantable device N/A 01/11/2016    Procedure: Loop Recorder Removal;  Surgeon: Evans Lance, MD;  Location: [place] CV LAB;  Service: Cardiovascular;  Laterality: N/A;     Family History  Problem Relation Age of Onset  . Heart attack Mother   . Heart attack  Sister   . Lung cancer Brother   . Lung cancer Sister   . Sudden Cardiac Death Father 5  . Colon cancer Sister   . Cancer Sister   . Heart failure Brother      Social History   Social History  . Marital Status: Married    Spouse Name: N/A  . Number of Children: N/A  . Years of Education: N/A   Occupational History  . Not on file.   Social History Main Topics  . Smoking status: Never Smoker   . Smokeless tobacco: Not on file  . Alcohol Use: No  . Drug Use: No  . Sexual Activity: Not on file   Other Topics Concern  . 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             BP 132/70 mmHg  Pulse 69  Ht 5' 9.5" (1.765 m)  Wt 161 lb 9.6 oz (73.301 kg)  BMI 23.53 kg/m2  Physical Exam:  Well appearing  elderly man, NAD HEENT: Unremarkable Neck:  6 cm JVD, no thyromegally Lymphatics:  No adenopathy Back:  No CVA tenderness Lungs:  Clear with no wheezes HEART:  Regular rate rhythm, no murmurs, no rubs, no clicks Abd:  soft, positive bowel sounds, no organomegally, no rebound, no guarding Ext:  2 plus pulses, no edema, no cyanosis, no clubbing Skin:  No rashes no nodules Neuro:  CN II through XII intact, motor grossly intact  EKG - nsr with LBBB  PM insertion - HIs Biotronik DDD PM is working normally. Will recheck in several months  Assess/Plan: 1. 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 His symptoms have resolved with PPM insertion. 2. HTN - his blood pressure is well controlled.  3. Dementia - this appears to be fairly mild. Will follow. 4. CAD - he denies anginal symptoms. Will follow.  [name].D.

## 2016-05-15 DIAGNOSIS — Z1283 Encounter for screening for malignant neoplasm of skin: Secondary | ICD-10-CM | POA: Diagnosis not present

## 2016-05-15 DIAGNOSIS — L570 Actinic keratosis: Secondary | ICD-10-CM | POA: Diagnosis not present

## 2016-05-15 DIAGNOSIS — X32XXXD Exposure to sunlight, subsequent encounter: Secondary | ICD-10-CM | POA: Diagnosis not present

## 2016-05-15 DIAGNOSIS — L989 Disorder of the skin and subcutaneous tissue, unspecified: Secondary | ICD-10-CM | POA: Diagnosis not present

## 2016-05-15 DIAGNOSIS — D225 Melanocytic nevi of trunk: Secondary | ICD-10-CM | POA: Diagnosis not present

## 2016-05-15 DIAGNOSIS — D485 Neoplasm of uncertain behavior of skin: Secondary | ICD-10-CM | POA: Diagnosis not present

## 2016-05-21 DIAGNOSIS — D485 Neoplasm of uncertain behavior of skin: Secondary | ICD-10-CM | POA: Diagnosis not present

## 2016-05-21 DIAGNOSIS — L98499 Non-pressure chronic ulcer of skin of other sites with unspecified severity: Secondary | ICD-10-CM | POA: Diagnosis not present

## 2016-05-24 DIAGNOSIS — C61 Malignant neoplasm of prostate: Secondary | ICD-10-CM | POA: Diagnosis not present

## 2016-05-24 DIAGNOSIS — Z8546 Personal history of malignant neoplasm of prostate: Secondary | ICD-10-CM | POA: Diagnosis not present

## 2016-06-16 ENCOUNTER — Emergency Department (HOSPITAL_COMMUNITY)
Admission: EM | Admit: 2016-06-16 | Discharge: 2016-06-16 | Disposition: A | Discharge status: Home / Self Care | Payer: Medicare Other | Attending: Emergency Medicine | Admitting: Emergency Medicine

## 2016-06-16 ENCOUNTER — Encounter (HOSPITAL_COMMUNITY): Payer: Self-pay | Admitting: Emergency Medicine

## 2016-06-16 ENCOUNTER — Emergency Department (HOSPITAL_COMMUNITY): Payer: Medicare Other

## 2016-06-16 DIAGNOSIS — R404 Transient alteration of awareness: Secondary | ICD-10-CM | POA: Diagnosis not present

## 2016-06-16 DIAGNOSIS — R55 Syncope and collapse: Secondary | ICD-10-CM | POA: Insufficient documentation

## 2016-06-16 DIAGNOSIS — Z950 Presence of cardiac pacemaker: Secondary | ICD-10-CM | POA: Insufficient documentation

## 2016-06-16 DIAGNOSIS — I2510 Atherosclerotic heart disease of native coronary artery without angina pectoris: Secondary | ICD-10-CM | POA: Diagnosis not present

## 2016-06-16 DIAGNOSIS — Z8546 Personal history of malignant neoplasm of prostate: Secondary | ICD-10-CM | POA: Diagnosis not present

## 2016-06-16 DIAGNOSIS — Z7982 Long term (current) use of aspirin: Secondary | ICD-10-CM | POA: Insufficient documentation

## 2016-06-16 DIAGNOSIS — Z79899 Other long term (current) drug therapy: Secondary | ICD-10-CM | POA: Diagnosis not present

## 2016-06-16 DIAGNOSIS — R531 Weakness: Secondary | ICD-10-CM | POA: Diagnosis not present

## 2016-06-16 LAB — CBC WITH DIFFERENTIAL/PLATELET
Basophils Absolute: 0 10*3/uL (ref 0.0–0.1)
Basophils Relative: 0 %
EOS PCT: 0 %
Eosinophils Absolute: 0 10*3/uL (ref 0.0–0.7)
HCT: 39.3 % (ref 39.0–52.0)
Hemoglobin: 12.8 g/dL — ABNORMAL LOW (ref 13.0–17.0)
LYMPHS ABS: 1.8 10*3/uL (ref 0.7–4.0)
LYMPHS PCT: 16 %
MCH: 30.4 pg (ref 26.0–34.0)
MCHC: 32.6 g/dL (ref 30.0–36.0)
MCV: 93.3 fL (ref 78.0–100.0)
MONO ABS: 0.7 10*3/uL (ref 0.1–1.0)
Monocytes Relative: 7 %
Neutro Abs: 8.5 10*3/uL — ABNORMAL HIGH (ref 1.7–7.7)
Neutrophils Relative %: 77 %
PLATELETS: 210 10*3/uL (ref 150–400)
RBC: 4.21 MIL/uL — ABNORMAL LOW (ref 4.22–5.81)
RDW: 12.9 % (ref 11.5–15.5)
WBC: 11 10*3/uL — ABNORMAL HIGH (ref 4.0–10.5)

## 2016-06-16 LAB — I-STAT TROPONIN, ED: Troponin i, poc: 0 ng/mL (ref 0.00–0.08)

## 2016-06-16 LAB — BASIC METABOLIC PANEL
Anion gap: 6 (ref 5–15)
BUN: 16 mg/dL (ref 6–20)
CALCIUM: 8.7 mg/dL — AB (ref 8.9–10.3)
CO2: 26 mmol/L (ref 22–32)
CREATININE: 1.35 mg/dL — AB (ref 0.61–1.24)
Chloride: 105 mmol/L (ref 101–111)
GFR calc Af Amer: 53 mL/min — ABNORMAL LOW (ref >60)
GFR, EST NON AFRICAN AMERICAN: 46 mL/min — AB (ref >60)
GLUCOSE: 100 mg/dL — AB (ref 65–99)
Potassium: 4.6 mmol/L (ref 3.5–5.1)
Sodium: 137 mmol/L (ref 135–145)

## 2016-06-16 LAB — MAGNESIUM: Magnesium: 2.1 mg/dL (ref 1.7–2.4)

## 2016-06-16 NOTE — ED Notes (Signed)
Patient transported to X-ray 

## 2016-06-16 NOTE — Discharge Instructions (Signed)
Continue taking your home medications as prescribed. Continue drinking fluids at home to remain hydrated. Follow-up with your cardiologist tomorrow to schedule a follow-up appointment for this week.  Please return to the Emergency Department if symptoms worsen or new onset of fever, headache, lightheadedness, dizziness, visual changes, confusion, change in mental status, chest pain, difficulty breathing, vomiting, unable to keep fluids down, numbness, tingling, weakness, syncope, seizure.

## 2016-06-16 NOTE — ED Notes (Signed)
Ambulated pt, pt did just fine, said he didn't feel dizzy.

## 2016-06-16 NOTE — ED Provider Notes (Signed)
[place] DEPT Provider Note   CSN: [id number] Arrival date & time: 06/16/16  1050     History   Chief Complaint Chief Complaint  Patient presents with  . Near Syncope    HPI Raymond Hinton is a 82 y.o. male.  Patient is a 82 year old male with past medical history of LBBB with pacemaker (Biotronik PM placed April 2017), prostate cancer, CAD (stress test 09/2011 showed no ischemia, EF 68%) and dementia who presents to the ED via EMS status post syncope that occurred prior to arrival. Patient's wife reports that while they were sitting at church for over an hour when she noticed the patient began to appear pale. He reports feeling dizzy and then the wife states that he passed out for a few seconds and became clammy. Patient was alert and oriented when he regained consciousness. Patient denies any other pain or complaints prior to onset of syncopal episode. He denies any pain or complaints at this time. Patient's daughter reports that he had multiple near syncopal episodes prior to having his pacemaker placed but states this is his first episode s/p placement. Denies any recent fall or head injury. Denies any recent changes in medications. Denies fever, chills, headache, nasal congestion, cough, SOB, palpitations, abdominal pain, N/V/D, urinary sxs, numbness, tingling, weakness.  PCP- Dr. Alroy Dust Cardiologist- Dr. Lovena Le      Past Medical History:  Diagnosis Date  . Arthritis   . Coronary artery disease   . Hx of cardiovascular stress test    Adenosine Myoview (09/2011):  No ischemia, EF 68%, low risk.  Marland Kitchen Hx of echocardiogram    a. Echo (09/2011):  EF 55-60%, mild MR, mild AI, mild LAE, PASP 35;  b. Echo (10/25/2013): Mild LVH, EF 55-60%, normal wall motion, mild AI, mild MR, PASP 43  . LBBB (left bundle branch block)   . Prostate CA [facility])    radiation seeds    Patient Active Problem List   Diagnosis Date Noted  . Complete heart block ([place]) 01/11/2016  . Pacemaker  01/11/2016  . SVT (supraventricular tachycardia) ([place]) 01/12/2015  . At risk for diabetes mellitus/borderline diabetes 10/25/2013  . Syncope 10/24/2013  . Leukocytosis 10/24/2013  . Left bundle branch block 09/04/2011  . Atypical chest pain 09/04/2011  . Murmur 09/04/2011    Past Surgical History:  Procedure Laterality Date  . APPENDECTOMY    . CATARACT EXTRACTION    . EP IMPLANTABLE DEVICE N/A 01/11/2016   Procedure: Pacemaker Implant;  Surgeon: Evans Lance, MD;  Location: [place] CV LAB;  Service: Cardiovascular;  Laterality: N/A;  . EP IMPLANTABLE DEVICE N/A 01/11/2016   Procedure: Loop Recorder Removal;  Surgeon: Evans Lance, MD;  Location: [place] CV LAB;  Service: Cardiovascular;  Laterality: N/A;  . LASIK    . LOOP RECORDER IMPLANT N/A 01/12/2015   Procedure: LOOP RECORDER IMPLANT;  Surgeon: Evans Lance, MD;  Location: [facility] CATH LAB;  Service: Cardiovascular;  Laterality: N/A;  . RADIOACTIVE SEED IMPLANT    . ROOT CANAL    . TONSILLECTOMY         Home Medications    Prior to Admission medications   Medication Sig Start Date End Date Taking? Authorizing Provider  Ascorbic Acid (VITAMIN C) 1000 MG tablet Take 1,000 mg by mouth daily.    Historical Provider, MD  aspirin EC 81 MG tablet Take 81 mg by mouth at bedtime.     Historical Provider, MD  cholecalciferol (VITAMIN D) 1000 UNITS tablet Take  1,000 Units by mouth daily.    Historical Provider, MD  citalopram (CELEXA) 10 MG tablet Take 10 mg by mouth daily. 04/14/15   Historical Provider, MD  diphenhydrAMINE (BENADRYL) 25 mg capsule Take 25 mg by mouth daily.    Historical Provider, MD  donepezil (ARICEPT) 10 MG tablet Take 10 mg by mouth daily.  12/31/14   Historical Provider, MD  Multiple Vitamins-Minerals (ICAPS PO) Take 1 capsule by mouth daily.    Historical Provider, MD  pantoprazole (PROTONIX) 40 MG tablet Take 40 mg by mouth daily. 12/18/15   Historical Provider, MD    Family History Family History    Problem Relation Age of Onset  . Heart attack Mother   . Sudden Cardiac Death Father 51  . Heart attack Sister   . Lung cancer Brother   . Lung cancer Sister   . Colon cancer Sister   . Cancer Sister   . Heart failure Brother     Social History Social History  Substance Use Topics  . Smoking status: Never Smoker  . Smokeless tobacco: Never Used  . Alcohol use No     Allergies   Penicillins and Codeine   Review of Systems Review of Systems  Constitutional: Positive for diaphoresis.  Neurological: Positive for dizziness and syncope.  All other systems reviewed and are negative.    Physical Exam Updated Vital Signs BP 113/65   Pulse (!) 59   Temp 97.8 F (36.6 C) (Oral)   Resp 13   Ht 5' 9.5" (1.765 m)   Wt 73 kg   SpO2 95%   BMI 23.43 kg/m   Physical Exam  Constitutional: He is oriented to person, place, and time. He appears well-developed and well-nourished. No distress.  HENT:  Head: Normocephalic and atraumatic. Head is without raccoon's eyes, without Battle's sign, without abrasion, without contusion and without laceration.  Right Ear: Tympanic membrane normal. No hemotympanum.  Left Ear: Tympanic membrane normal. No hemotympanum.  Nose: Nose normal. No nose lacerations, sinus tenderness, nasal deformity, septal deviation or nasal septal hematoma. No epistaxis.  Mouth/Throat: Uvula is midline, oropharynx is clear and moist and mucous membranes are normal. No oropharyngeal exudate, posterior oropharyngeal edema, posterior oropharyngeal erythema or tonsillar abscesses. No tonsillar exudate.  Eyes: Conjunctivae and EOM are normal. Pupils are equal, round, and reactive to light. Right eye exhibits no discharge. Left eye exhibits no discharge. No scleral icterus.  Neck: Normal range of motion. Neck supple.  Cardiovascular: Normal rate, regular rhythm, normal heart sounds and intact distal pulses.   Pulmonary/Chest: Effort normal and breath sounds normal. No  respiratory distress. He has no wheezes. He has no rales. He exhibits no tenderness.  Abdominal: Soft. Bowel sounds are normal. He exhibits no distension and no mass. There is no tenderness. There is no rebound and no guarding. No hernia.  Musculoskeletal: Normal range of motion. He exhibits no edema.  No midline C, T, or L tenderness. Full range of motion of neck and back. Full range of motion of bilateral upper and lower extremities, with 5/5 strength. Sensation intact. 2+ radial and PT pulses. Cap refill <2 seconds.   Neurological: He is alert and oriented to person, place, and time. He has normal strength. No cranial nerve deficit or sensory deficit. He displays a negative Romberg sign. Coordination normal.  Normal finger-nose-finger  Skin: Skin is warm and dry. He is not diaphoretic.  Nursing note and vitals reviewed.    ED Treatments / Results  Labs (all labs  ordered are listed, but only abnormal results are displayed) Labs Reviewed  CBC WITH DIFFERENTIAL/PLATELET - Abnormal; Notable for the following:       Result Value   WBC 11.0 (*)    RBC 4.21 (*)    Hemoglobin 12.8 (*)    Neutro Abs 8.5 (*)    All other components within normal limits  BASIC METABOLIC PANEL - Abnormal; Notable for the following:    Glucose, Bld 100 (*)    Creatinine, Ser 1.35 (*)    Calcium 8.7 (*)    GFR calc non Af Amer 46 (*)    GFR calc Af Amer 53 (*)    All other components within normal limits  MAGNESIUM  I-STAT TROPOININ, ED    EKG  EKG Interpretation  Date/Time:  Sunday June 16 2016 11:07:22 EDT Ventricular Rate:  59 PR Interval:    QRS Duration: 142 QT Interval:  473 QTC Calculation: 469 R Axis:   -15 Text Interpretation:  Sinus rhythm Prolonged PR interval Left bundle branch block No significant change since last tracing Confirmed by Maryan Rued  MD, Loree Fee ([id number]) on 06/16/2016 11:25:36 AM       Radiology Dg Chest 2 View  Result Date: 06/16/2016 CLINICAL DATA:  Syncopal  episode around 10 a.m. today. EXAM: CHEST  2 VIEW COMPARISON:  01/25/2016 FINDINGS: The lungs are clear wiithout focal pneumonia, edema, pneumothorax or pleural effusion. Calcified granuloma left mid lung is stable. The cardiopericardial silhouette is within normal limits for size. Left-sided dual lead permanent pacemaker remains in place. The visualized bony structures of the thorax are intact. IMPRESSION: No active cardiopulmonary disease. Electronically Signed   By: Misty Stanley M.D.   On: 06/16/2016 12:06    Procedures Procedures (including critical care time)  Medications Ordered in ED Medications - No data to display   Initial Impression / Assessment and Plan / ED Course  I have reviewed the triage vital signs and the nursing notes.  Pertinent labs & imaging results that were available during my care of the patient were reviewed by me and considered in my medical decision making (see chart for details).  Clinical Course    Patient presents with witnessed syncopal episode that occurred while sitting at church prior to arrival. Right reports patient was sitting for an hour when he suddenly looked pale reported feeling dizzy and then became clammy and was unconscious for a few seconds. Patient alert and oriented when he regained consciousness. Denies fall or head injury. History of LBBB with pacemaker (Biotronik PM placed April 2017), prostate cancer, CAD (stress test 09/2011 showed no ischemia, EF 68%) and dementia. Denies use of anticoagulants. VSS. On initial exam patient denied any pain or complaints. Exam unremarkable. No neuro deficits.  EKG showed sinus rhythm with left bundle branch block, unchanged from prior. Chest x-ray negative. Troponin negative. Remaining labs unremarkable. Reticulocyte from [employer] came to the ED and interrogated patient's pacemaker. She states no reported incidents on pt's pacemaker interrogation and notes he had normal HR in 70-80 over the past 24hrs. on  reevaluation patient is resting comfortably in room and continues to deny any pain or complaints at this time. Negative orthostatics. Patient able to stand and ambulate without assistance. Patient has remained hemodynamically stable in the ED. Suspect patient's syncopal episode may likely be postural as patient's wife reports he has had similar episodes in the past after sitting for long periods of time. Discussed results and plan for discharge with patient. Plan to discharge patient home  with close outpatient follow-up with cardiology/PCP. Discussed strict return precautions with patient.  Final Clinical Impressions(s) / ED Diagnoses   Final diagnoses:  Syncope, unspecified syncope type    New Prescriptions Discharge Medication List as of 06/16/2016  2:58 PM       Nona Dell, PA-C 06/16/16 [place], MD 06/16/16 2053

## 2016-06-16 NOTE — ED Triage Notes (Signed)
Pt here from church with c/o near syncopal episode , pt states that she got hot and flushed , no nausea no sob

## 2016-06-17 ENCOUNTER — Telehealth: Payer: Self-pay | Admitting: Internal Medicine

## 2016-06-17 NOTE — Telephone Encounter (Signed)
Pt c/o Syncope: STAT if syncope occurred within 30 minutes and pt complains of lightheadedness High Priority if episode of passing out, completely, today or in last 24 hours   1. Did you pass out today? 06/16/16  2. When is the last time you passed out? 06-16-16  3. Has this occurred multiple times? 3rd time   4. Did you have any symptoms prior to passing out? dizzy

## 2016-06-17 NOTE — Telephone Encounter (Signed)
SPOKE  WITH PT'S  WIFE ,  WHILE  AT  Sunday  SCHOOL  YESTERDAY   PT  PASSED OUT ,  THIS  IS  FIRST  EPISODE  SINCE  PPM  IMPLANT , WAS  TAKEN TO  ED  FOR  EVALUATION  REP  CHECKED  PM AND  LABS  ARE  OKAY  DISCUSSED WITH  DR  Lovena Le   AFTER HE   REVIEWED HOSPITAL  RECORDS  RECOMMENDED AN  APPT FOR  PT. NEEDS  AN  APPT  IN COUPLE OF WEEKS  WITH  DR  Lovena Le  FOR  LIKELY VASO  DEPRESSION  EPISODE . PT'S  WIFE  AWARE  .WILL FORWARD TO  MELISSA TO SCHEDULE .Adonis Housekeeper

## 2016-07-02 ENCOUNTER — Encounter: Payer: Self-pay | Admitting: Internal Medicine

## 2016-07-02 ENCOUNTER — Ambulatory Visit (INDEPENDENT_AMBULATORY_CARE_PROVIDER_SITE_OTHER): Payer: Medicare Other | Admitting: Internal Medicine

## 2016-07-02 VITALS — BP 108/70 | HR 69 | Ht 69.5 in | Wt 154.8 lb

## 2016-07-02 DIAGNOSIS — Z950 Presence of cardiac pacemaker: Secondary | ICD-10-CM

## 2016-07-02 DIAGNOSIS — I442 Atrioventricular block, complete: Secondary | ICD-10-CM

## 2016-07-02 LAB — CUP PACEART INCLINIC DEVICE CHECK
Date Time Interrogation Session: 20170926134555
Implantable Lead Implant Date: 20170406
Implantable Lead Model: 377
Implantable Lead Serial Number: 49394856
Lead Channel Impedance Value: 507 Ohm
Lead Channel Pacing Threshold Amplitude: 0.7 V
Lead Channel Pacing Threshold Amplitude: 0.8 V
Lead Channel Pacing Threshold Pulse Width: 0.4 ms
Lead Channel Pacing Threshold Pulse Width: 0.4 ms
Lead Channel Pacing Threshold Pulse Width: 0.4 ms
Lead Channel Sensing Intrinsic Amplitude: 0.6 mV
Lead Channel Sensing Intrinsic Amplitude: 11.7 mV
Lead Channel Sensing Intrinsic Amplitude: 12 mV
Lead Channel Setting Pacing Amplitude: 2 V
Lead Channel Setting Pacing Pulse Width: 0.4 ms
MDC IDC LEAD IMPLANT DT: 20170406
MDC IDC LEAD LOCATION: 753859
MDC IDC LEAD LOCATION: 753860
MDC IDC LEAD SERIAL: 49386458
MDC IDC MSMT LEADCHNL RA PACING THRESHOLD AMPLITUDE: 0.8 V
MDC IDC MSMT LEADCHNL RA PACING THRESHOLD PULSEWIDTH: 0.4 ms
MDC IDC MSMT LEADCHNL RA PACING THRESHOLD PULSEWIDTH: 0.4 ms
MDC IDC MSMT LEADCHNL RA SENSING INTR AMPL: 0.6 mV
MDC IDC MSMT LEADCHNL RV IMPEDANCE VALUE: 604 Ohm
MDC IDC MSMT LEADCHNL RV PACING THRESHOLD AMPLITUDE: 1 V
MDC IDC MSMT LEADCHNL RV PACING THRESHOLD AMPLITUDE: 1 V
MDC IDC MSMT LEADCHNL RV PACING THRESHOLD AMPLITUDE: 1 V
MDC IDC MSMT LEADCHNL RV PACING THRESHOLD PULSEWIDTH: 0.4 ms
MDC IDC SET LEADCHNL RV PACING AMPLITUDE: 2.4 V
MDC IDC STAT BRADY RA PERCENT PACED: 26 %
MDC IDC STAT BRADY RV PERCENT PACED: 0 %
Pulse Gen Model: 394969
Pulse Gen Serial Number: 68723375

## 2016-07-02 NOTE — Patient Instructions (Addendum)
Medication Instructions:  Your physician recommends that you continue on your current medications as directed. Please refer to the Current Medication list given to you today.   Labwork: None Ordered   Testing/Procedures: None Ordered   Follow-Up: Your physician wants you to follow-up in: 6 months with Dr. Lovena Le. You will receive a reminder letter in the mail two months in advance. If you don't receive a letter, please call our office to schedule the follow-up appointment.  Remote monitoring is used to monitor your Pacemaker from home. This monitoring reduces the number of office visits required to check your device to one time per year. It allows [place] to keep an eye on the functioning of your device to ensure it is working properly. You are scheduled for a device check from home on 10/01/16. You may send your transmission at any time that day. If you have a wireless device, the transmission will be sent automatically. After your physician reviews your transmission, you will receive a postcard with your next transmission date.     Any Other Special Instructions Will Be Listed Below (If Applicable).      If you need a refill on your cardiac medications before your next appointment, please call your pharmacy.

## 2016-07-02 NOTE — Progress Notes (Signed)
HPI Mr. Egeland returns today for ongoing PPM followup.  The patient has had longstanding baseline LBBB. He underwent ILR insertion 20 months ago. He has no remote history of syncope. He has been diagnosed with dementia. He was found to have dizzy spells associated with daytime 4 second pauses. He underwent PPM insertion several months ago. Several weeks ago, he was at church and began to feel dizzy and passed out again. No loss of continence or tongue biting. No injury. He returns today for PPM interogation.   Allergies  Allergen Reactions  . Penicillins Other (See Comments)    Patient states that it makes him go "crazy" Has patient had a PCN reaction causing immediate rash, facial/tongue/throat swelling, SOB or lightheadedness with hypotension: Yes Has patient had a PCN reaction causing severe rash involving mucus membranes or skin necrosis: No Has patient had a PCN reaction that required hospitalization No Has patient had a PCN reaction occurring within the last 10 years: No If all of the above answers are "NO", then may proceed with Cephalosporin use.   . Codeine Other (See Comments)    Patient states that it makes him go "crazy"     Current Outpatient Prescriptions  Medication Sig Dispense Refill  . Ascorbic Acid (VITAMIN C) 1000 MG tablet Take 1,000 mg by mouth daily.    Marland Kitchen aspirin EC 81 MG tablet Take 81 mg by mouth at bedtime.     . cholecalciferol (VITAMIN D) 1000 UNITS tablet Take 1,000 Units by mouth daily.    . citalopram (CELEXA) 10 MG tablet Take 10 mg by mouth daily.    . diphenhydrAMINE (BENADRYL) 25 mg capsule Take 25 mg by mouth daily.    Marland Kitchen donepezil (ARICEPT) 10 MG tablet Take 10 mg by mouth daily.   1  . Multiple Vitamins-Minerals (ICAPS PO) Take 1 capsule by mouth daily.    . pantoprazole (PROTONIX) 40 MG tablet Take 40 mg by mouth daily.  1   No current facility-administered medications for this visit.      Past Medical History:  Diagnosis Date  .  Arthritis   . Coronary artery disease   . Hx of cardiovascular stress test    Adenosine Myoview (09/2011):  No ischemia, EF 68%, low risk.  Marland Kitchen Hx of echocardiogram    a. Echo (09/2011):  EF 55-60%, mild MR, mild AI, mild LAE, PASP 35;  b. Echo (10/25/2013): Mild LVH, EF 55-60%, normal wall motion, mild AI, mild MR, PASP 43  . LBBB (left bundle branch block)   . Prostate CA ([place])    radiation seeds    ROS:   All systems reviewed and negative except as noted in the HPI.   Past Surgical History:  Procedure Laterality Date  . APPENDECTOMY    . CATARACT EXTRACTION    . EP IMPLANTABLE DEVICE N/A 01/11/2016   Procedure: Pacemaker Implant;  Surgeon: Evans Lance, MD;  Location: [place] CV LAB;  Service: Cardiovascular;  Laterality: N/A;  . EP IMPLANTABLE DEVICE N/A 01/11/2016   Procedure: Loop Recorder Removal;  Surgeon: Evans Lance, MD;  Location: [place] CV LAB;  Service: Cardiovascular;  Laterality: N/A;  . LASIK    . LOOP RECORDER IMPLANT N/A 01/12/2015   Procedure: LOOP RECORDER IMPLANT;  Surgeon: Evans Lance, MD;  Location: [facility] CATH LAB;  Service: Cardiovascular;  Laterality: N/A;  . RADIOACTIVE SEED IMPLANT    . ROOT CANAL    . TONSILLECTOMY  Family History  Problem Relation Age of Onset  . Heart attack Mother   . Sudden Cardiac Death Father 89  . Heart attack Sister   . Lung cancer Brother   . Lung cancer Sister   . Colon cancer Sister   . Cancer Sister   . Heart failure Brother      Social History   Social History  . Marital status: Married    Spouse name: N/A  . Number of children: N/A  . Years of education: N/A   Occupational History  . Not on file.   Social History Main Topics  . Smoking status: Never Smoker  . Smokeless tobacco: Never Used  . Alcohol use No  . Drug use: No  . Sexual activity: Not on file   Other Topics Concern  . 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              BP 108/70   Pulse 69   Ht 5' 9.5" (1.765 m)   Wt 154 lb 12.8 oz (70.2 kg)   BMI 22.53 kg/m   Physical Exam:  Well appearing elderly man, NAD HEENT: Unremarkable Neck:  6 cm JVD, no thyromegally Lymphatics:  No adenopathy Back:  No CVA tenderness Lungs:  Clear with no wheezes HEART:  Regular rate rhythm, no murmurs, no rubs, no clicks Abd:  soft, positive bowel sounds, no organomegally, no rebound, no guarding Ext:  2 plus pulses, no edema, no cyanosis, no clubbing Skin:  No rashes no nodules Neuro:  CN II through XII intact, motor grossly intact   PM insertion - HIs [employer] DDD PM is working normally. Will recheck in several months. We increased the sensitivity of his rate response.  Assess/Plan: 1. 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 We have looked for any arrhythmias on his device and found none. We turned the rate response up today on his CLS. 2. HTN - his blood pressure is well controlled. It has been running low. I have asked him to increase his salt and fluid intake. 3. Dementia - this appears to be fairly mild. Will follow. 4. CAD - he denies anginal symptoms. Will follow.  [name].D.

## 2016-07-06 DIAGNOSIS — R030 Elevated blood-pressure reading, without diagnosis of hypertension: Secondary | ICD-10-CM | POA: Diagnosis not present

## 2016-07-06 DIAGNOSIS — R197 Diarrhea, unspecified: Secondary | ICD-10-CM | POA: Diagnosis not present

## 2016-09-18 DIAGNOSIS — Z961 Presence of intraocular lens: Secondary | ICD-10-CM | POA: Diagnosis not present

## 2016-09-18 DIAGNOSIS — H21301 Idiopathic cysts of iris, ciliary body or anterior chamber, right eye: Secondary | ICD-10-CM | POA: Diagnosis not present

## 2016-10-01 ENCOUNTER — Ambulatory Visit (INDEPENDENT_AMBULATORY_CARE_PROVIDER_SITE_OTHER): Payer: Medicare Other | Admitting: *Deleted

## 2016-10-01 DIAGNOSIS — I442 Atrioventricular block, complete: Secondary | ICD-10-CM | POA: Diagnosis not present

## 2016-10-01 NOTE — Progress Notes (Signed)
Remote pacemaker transmission.   

## 2016-10-02 LAB — CUP PACEART REMOTE DEVICE CHECK
Brady Statistic RA Percent Paced: 42 %
Brady Statistic RV Percent Paced: 0 %
Implantable Lead Implant Date: 20170406
Implantable Lead Model: 377
Implantable Lead Serial Number: 49386458
Implantable Lead Serial Number: 49394856
Implantable Pulse Generator Implant Date: 20170406
Lead Channel Impedance Value: 488 Ohm
Lead Channel Impedance Value: 566 Ohm
Lead Channel Setting Pacing Amplitude: 2.4 V
Lead Channel Setting Pacing Pulse Width: 0.4 ms
MDC IDC LEAD IMPLANT DT: 20170406
MDC IDC LEAD LOCATION: 753859
MDC IDC LEAD LOCATION: 753860
MDC IDC PG SERIAL: 68723375
MDC IDC SESS DTM: 20171227090232
MDC IDC SET LEADCHNL RA PACING AMPLITUDE: 2 V

## 2016-10-04 ENCOUNTER — Encounter: Payer: Self-pay | Admitting: Cardiology

## 2016-10-14 DIAGNOSIS — K219 Gastro-esophageal reflux disease without esophagitis: Secondary | ICD-10-CM | POA: Diagnosis not present

## 2016-10-14 DIAGNOSIS — J310 Chronic rhinitis: Secondary | ICD-10-CM | POA: Diagnosis not present

## 2016-10-14 DIAGNOSIS — M150 Primary generalized (osteo)arthritis: Secondary | ICD-10-CM | POA: Diagnosis not present

## 2016-10-14 DIAGNOSIS — R413 Other amnesia: Secondary | ICD-10-CM | POA: Diagnosis not present

## 2016-10-14 DIAGNOSIS — F320 Major depressive disorder, single episode, mild: Secondary | ICD-10-CM | POA: Diagnosis not present

## 2016-10-18 ENCOUNTER — Encounter: Payer: Self-pay | Admitting: Cardiology

## 2016-11-20 DIAGNOSIS — L570 Actinic keratosis: Secondary | ICD-10-CM | POA: Diagnosis not present

## 2016-11-20 DIAGNOSIS — Z1283 Encounter for screening for malignant neoplasm of skin: Secondary | ICD-10-CM | POA: Diagnosis not present

## 2016-11-20 DIAGNOSIS — D225 Melanocytic nevi of trunk: Secondary | ICD-10-CM | POA: Diagnosis not present

## 2016-11-20 DIAGNOSIS — X32XXXD Exposure to sunlight, subsequent encounter: Secondary | ICD-10-CM | POA: Diagnosis not present

## 2016-11-24 ENCOUNTER — Observation Stay (HOSPITAL_COMMUNITY)
Admission: EM | Admit: 2016-11-24 | Discharge: 2016-11-25 | Disposition: A | Discharge status: Home / Self Care | Payer: Medicare Other | Attending: Internal Medicine | Admitting: Internal Medicine

## 2016-11-24 ENCOUNTER — Emergency Department (HOSPITAL_COMMUNITY): Payer: Medicare Other

## 2016-11-24 ENCOUNTER — Encounter (HOSPITAL_COMMUNITY): Payer: Self-pay

## 2016-11-24 DIAGNOSIS — C61 Malignant neoplasm of prostate: Secondary | ICD-10-CM | POA: Insufficient documentation

## 2016-11-24 DIAGNOSIS — I371 Nonrheumatic pulmonary valve insufficiency: Secondary | ICD-10-CM | POA: Diagnosis not present

## 2016-11-24 DIAGNOSIS — I10 Essential (primary) hypertension: Secondary | ICD-10-CM | POA: Insufficient documentation

## 2016-11-24 DIAGNOSIS — Z79899 Other long term (current) drug therapy: Secondary | ICD-10-CM | POA: Insufficient documentation

## 2016-11-24 DIAGNOSIS — I951 Orthostatic hypotension: Secondary | ICD-10-CM | POA: Diagnosis not present

## 2016-11-24 DIAGNOSIS — R011 Cardiac murmur, unspecified: Secondary | ICD-10-CM | POA: Diagnosis present

## 2016-11-24 DIAGNOSIS — Z9181 History of falling: Secondary | ICD-10-CM | POA: Insufficient documentation

## 2016-11-24 DIAGNOSIS — N179 Acute kidney failure, unspecified: Secondary | ICD-10-CM | POA: Diagnosis not present

## 2016-11-24 DIAGNOSIS — Z950 Presence of cardiac pacemaker: Secondary | ICD-10-CM | POA: Insufficient documentation

## 2016-11-24 DIAGNOSIS — R001 Bradycardia, unspecified: Secondary | ICD-10-CM | POA: Insufficient documentation

## 2016-11-24 DIAGNOSIS — Z7982 Long term (current) use of aspirin: Secondary | ICD-10-CM | POA: Insufficient documentation

## 2016-11-24 DIAGNOSIS — I2510 Atherosclerotic heart disease of native coronary artery without angina pectoris: Secondary | ICD-10-CM | POA: Diagnosis not present

## 2016-11-24 DIAGNOSIS — Z809 Family history of malignant neoplasm, unspecified: Secondary | ICD-10-CM | POA: Insufficient documentation

## 2016-11-24 DIAGNOSIS — Z800 Family history of malignant neoplasm of digestive organs: Secondary | ICD-10-CM | POA: Insufficient documentation

## 2016-11-24 DIAGNOSIS — Z8249 Family history of ischemic heart disease and other diseases of the circulatory system: Secondary | ICD-10-CM | POA: Diagnosis not present

## 2016-11-24 DIAGNOSIS — I447 Left bundle-branch block, unspecified: Secondary | ICD-10-CM | POA: Diagnosis not present

## 2016-11-24 DIAGNOSIS — I442 Atrioventricular block, complete: Secondary | ICD-10-CM | POA: Insufficient documentation

## 2016-11-24 DIAGNOSIS — I700 Atherosclerosis of aorta: Secondary | ICD-10-CM | POA: Insufficient documentation

## 2016-11-24 DIAGNOSIS — Z8546 Personal history of malignant neoplasm of prostate: Secondary | ICD-10-CM | POA: Insufficient documentation

## 2016-11-24 DIAGNOSIS — R55 Syncope and collapse: Secondary | ICD-10-CM | POA: Diagnosis not present

## 2016-11-24 DIAGNOSIS — R9431 Abnormal electrocardiogram [ECG] [EKG]: Secondary | ICD-10-CM | POA: Diagnosis not present

## 2016-11-24 DIAGNOSIS — Z923 Personal history of irradiation: Secondary | ICD-10-CM | POA: Insufficient documentation

## 2016-11-24 DIAGNOSIS — Z9849 Cataract extraction status, unspecified eye: Secondary | ICD-10-CM | POA: Diagnosis not present

## 2016-11-24 DIAGNOSIS — Z801 Family history of malignant neoplasm of trachea, bronchus and lung: Secondary | ICD-10-CM | POA: Insufficient documentation

## 2016-11-24 DIAGNOSIS — I082 Rheumatic disorders of both aortic and tricuspid valves: Secondary | ICD-10-CM | POA: Diagnosis not present

## 2016-11-24 DIAGNOSIS — Z880 Allergy status to penicillin: Secondary | ICD-10-CM | POA: Diagnosis not present

## 2016-11-24 DIAGNOSIS — Z9189 Other specified personal risk factors, not elsewhere classified: Secondary | ICD-10-CM

## 2016-11-24 DIAGNOSIS — F0390 Unspecified dementia without behavioral disturbance: Secondary | ICD-10-CM | POA: Insufficient documentation

## 2016-11-24 DIAGNOSIS — R404 Transient alteration of awareness: Secondary | ICD-10-CM | POA: Diagnosis not present

## 2016-11-24 DIAGNOSIS — Z885 Allergy status to narcotic agent status: Secondary | ICD-10-CM | POA: Diagnosis not present

## 2016-11-24 LAB — BASIC METABOLIC PANEL
Anion gap: 10 (ref 5–15)
BUN: 20 mg/dL (ref 6–20)
CALCIUM: 8.7 mg/dL — AB (ref 8.9–10.3)
CHLORIDE: 106 mmol/L (ref 101–111)
CO2: 22 mmol/L (ref 22–32)
CREATININE: 1.33 mg/dL — AB (ref 0.61–1.24)
GFR calc non Af Amer: 46 mL/min — ABNORMAL LOW (ref >60)
GFR, EST AFRICAN AMERICAN: 54 mL/min — AB (ref >60)
Glucose, Bld: 101 mg/dL — ABNORMAL HIGH (ref 65–99)
Potassium: 4.1 mmol/L (ref 3.5–5.1)
SODIUM: 138 mmol/L (ref 135–145)

## 2016-11-24 LAB — TROPONIN I: Troponin I: <0.03 ng/mL (ref <0.03)

## 2016-11-24 LAB — CBC
HCT: 39.4 % (ref 39.0–52.0)
Hemoglobin: 13.1 g/dL (ref 13.0–17.0)
MCH: 30.1 pg (ref 26.0–34.0)
MCHC: 33.2 g/dL (ref 30.0–36.0)
MCV: 90.6 fL (ref 78.0–100.0)
PLATELETS: 253 10*3/uL (ref 150–400)
RBC: 4.35 MIL/uL (ref 4.22–5.81)
RDW: 12.9 % (ref 11.5–15.5)
WBC: 7.7 10*3/uL (ref 4.0–10.5)

## 2016-11-24 LAB — URINALYSIS, ROUTINE W REFLEX MICROSCOPIC
Bacteria, UA: NONE SEEN
Bilirubin Urine: NEGATIVE
GLUCOSE, UA: NEGATIVE mg/dL
HGB URINE DIPSTICK: NEGATIVE
Ketones, ur: NEGATIVE mg/dL
LEUKOCYTES UA: NEGATIVE
NITRITE: NEGATIVE
Protein, ur: 30 mg/dL — AB
SPECIFIC GRAVITY, URINE: 1.026 (ref 1.005–1.030)
pH: 5 (ref 5.0–8.0)

## 2016-11-24 LAB — MAGNESIUM: MAGNESIUM: 2.1 mg/dL (ref 1.7–2.4)

## 2016-11-24 LAB — CBG MONITORING, ED: GLUCOSE-CAPILLARY: 90 mg/dL (ref 65–99)

## 2016-11-24 LAB — TSH: TSH: 2.989 u[IU]/mL (ref 0.350–4.500)

## 2016-11-24 LAB — PHOSPHORUS: PHOSPHORUS: 4.1 mg/dL (ref 2.5–4.6)

## 2016-11-24 MED ORDER — SODIUM CHLORIDE 0.9% FLUSH
3.0000 mL | Freq: Two times a day (BID) | INTRAVENOUS | Status: DC
  Administered 2016-11-25: 3 mL via INTRAVENOUS

## 2016-11-24 MED ORDER — SODIUM CHLORIDE 0.9 % IV SOLN
INTRAVENOUS | Status: AC
  Administered 2016-11-24: 17:00:00 via INTRAVENOUS

## 2016-11-24 MED ORDER — CITALOPRAM HYDROBROMIDE 10 MG PO TABS
10.0000 mg | ORAL_TABLET | Freq: Every day | ORAL | Status: DC
  Administered 2016-11-25: 10 mg via ORAL
  Filled 2016-11-24 (×2): qty 1

## 2016-11-24 MED ORDER — ONDANSETRON HCL 4 MG PO TABS: 4.0000 mg | ORAL_TABLET | Freq: Four times a day (QID) | ORAL | Status: DC | PRN

## 2016-11-24 MED ORDER — ACETAMINOPHEN 325 MG PO TABS
650.0000 mg | ORAL_TABLET | Freq: Four times a day (QID) | ORAL | Status: DC | PRN
  Filled 2016-11-24: qty 2

## 2016-11-24 MED ORDER — ONDANSETRON HCL 4 MG/2ML IJ SOLN: 4.0000 mg | Freq: Four times a day (QID) | INTRAMUSCULAR | Status: DC | PRN

## 2016-11-24 MED ORDER — ENOXAPARIN SODIUM 40 MG/0.4ML ~~LOC~~ SOLN
40.0000 mg | SUBCUTANEOUS | Status: DC
  Administered 2016-11-24: 40 mg via SUBCUTANEOUS
  Filled 2016-11-24 (×2): qty 0.4

## 2016-11-24 MED ORDER — VITAMIN D 1000 UNITS PO TABS
1000.0000 [IU] | ORAL_TABLET | Freq: Every day | ORAL | Status: DC
  Administered 2016-11-25: 1000 [IU] via ORAL
  Filled 2016-11-24: qty 1

## 2016-11-24 MED ORDER — PANTOPRAZOLE SODIUM 40 MG PO TBEC
40.0000 mg | DELAYED_RELEASE_TABLET | Freq: Every day | ORAL | Status: DC
  Administered 2016-11-24 – 2016-11-25 (×2): 40 mg via ORAL
  Filled 2016-11-24 (×2): qty 1

## 2016-11-24 MED ORDER — VITAMIN C 500 MG PO TABS
1000.0000 mg | ORAL_TABLET | Freq: Every day | ORAL | Status: DC
  Administered 2016-11-25: 1000 mg via ORAL
  Filled 2016-11-24: qty 2

## 2016-11-24 MED ORDER — ACETAMINOPHEN 650 MG RE SUPP: 650.0000 mg | Freq: Four times a day (QID) | RECTAL | Status: DC | PRN

## 2016-11-24 MED ORDER — ASPIRIN EC 81 MG PO TBEC
81.0000 mg | DELAYED_RELEASE_TABLET | Freq: Every day | ORAL | Status: DC
  Administered 2016-11-24: 81 mg via ORAL
  Filled 2016-11-24: qty 1

## 2016-11-24 NOTE — H&P (Signed)
History and Physical    Raymond Hinton [id number] DOB: 1927/07/15 DOA: 11/24/2016  PCP: Donnie Coffin (Inactive) Patient coming from: home  Chief Complaint: syncope  HPI: Raymond Hinton is a very pleasant 83 y.o. male with medical history significant for dementia, CBBB s/p pacer, htn, syncope since to the emergency Department chief complaint of syncope.  Information is obtained from the patient and his family who is at the bedside noting that information from patient may be unreliable due to early dementia. Patient has been in his usual state of health until this morning he was at church he sat down and then stood up quickly (as it was not time to sit) and suddenly felt dizzy and weak and "just tired". He then sat down and reports his next memory is "zap". He awakened several people leaning over him telemetry may need to go the hospital. He resisted. He denies any headache visual disturbances numbness tingling of extremities. He denies any chest pain palpitations shortness of breath diaphoresis nausea vomiting. He denies any dysuria hematuria frequency or urgency. He denies lower extremity edema fever chills or sick contacts or recent travel. Family reports he has had several episodes of syncope over the years go pacemaker approximately a half ago.   ED Course: In the emergency department he's afebrile hemodynamically stable with heart rate of 56. He is not hypoxic he is nontoxic appearing.   Review of Systems: As per HPI otherwise 10 point review of systems negative.   Ambulatory Status: He ambulates independently independent with ADLs  Past Medical History:  Diagnosis Date  . Arthritis   . Coronary artery disease   . Hx of cardiovascular stress test    Adenosine Myoview (09/2011):  No ischemia, EF 68%, low risk.  Marland Kitchen Hx of echocardiogram    a. Echo (09/2011):  EF 55-60%, mild MR, mild AI, mild LAE, PASP 35;  b. Echo (10/25/2013): Mild LVH, EF 55-60%, normal wall motion, mild AI, mild MR,  PASP 43  . LBBB (left bundle branch block)   . Prostate CA [facility])    radiation seeds    Past Surgical History:  Procedure Laterality Date  . APPENDECTOMY    . CATARACT EXTRACTION    . EP IMPLANTABLE DEVICE N/A 01/11/2016   Procedure: Pacemaker Implant;  Surgeon: Evans Lance, MD;  Location: [place] [facility];  Service: Cardiovascular;  Laterality: N/A;  . EP IMPLANTABLE DEVICE N/A 01/11/2016   Procedure: Loop Recorder Removal;  Surgeon: Evans Lance, MD;  Location: [place] [facility];  Service: Cardiovascular;  Laterality: N/A;  . LASIK    . LOOP RECORDER IMPLANT N/A 01/12/2015   Procedure: LOOP RECORDER IMPLANT;  Surgeon: Evans Lance, MD;  Location: [facility] CATH LAB;  Service: Cardiovascular;  Laterality: N/A;  . RADIOACTIVE SEED IMPLANT    . ROOT CANAL    . TONSILLECTOMY      Social History   Social History  . Marital status: Married    Spouse name: N/A  . Number of children: N/A  . Years of education: N/A   Occupational History  . Not on file.   Social History Main Topics  . Smoking status: Never Smoker  . Smokeless tobacco: Never Used  . Alcohol use No  . Drug use: No  . Sexual activity: Not on file   Other Topics Concern  . 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  Allergies  Allergen Reactions  . Penicillins Other (See Comments)    Patient states that it makes him go "crazy" Has patient had a PCN reaction causing immediate rash, facial/tongue/throat swelling, SOB or lightheadedness with hypotension: Yes Has patient had a PCN reaction causing severe rash involving mucus membranes or skin necrosis: No Has patient had a PCN reaction that required hospitalization No Has patient had a PCN reaction occurring within the last 10 years: No If all of the above answers are "NO", then may proceed with Cephalosporin use.   . Codeine Other (See Comments)    Patient states that it makes him go "crazy"    Family  History  Problem Relation Age of Onset  . Heart attack Mother   . Sudden Cardiac Death Father 97  . Heart attack Sister   . Lung cancer Brother   . Lung cancer Sister   . Colon cancer Sister   . Cancer Sister   . Heart failure Brother     Prior to Admission medications   Medication Sig Start Date End Date Taking? Authorizing Provider  Ascorbic Acid (VITAMIN C) 1000 MG tablet Take 1,000 mg by mouth daily.    Historical Provider, MD  aspirin EC 81 MG tablet Take 81 mg by mouth at bedtime.     Historical Provider, MD  cholecalciferol (VITAMIN D) 1000 UNITS tablet Take 1,000 Units by mouth daily.    Historical Provider, MD  citalopram (CELEXA) 10 MG tablet Take 10 mg by mouth daily. 04/14/15   Historical Provider, MD  diphenhydrAMINE (BENADRYL) 25 mg capsule Take 25 mg by mouth daily.    Historical Provider, MD  donepezil (ARICEPT) 10 MG tablet Take 10 mg by mouth daily.  12/31/14   Historical Provider, MD  Multiple Vitamins-Minerals (ICAPS PO) Take 1 capsule by mouth daily.    Historical Provider, MD  pantoprazole (PROTONIX) 40 MG tablet Take 40 mg by mouth daily. 12/18/15   Historical Provider, MD    Physical Exam: Vitals:   11/24/16 1139  BP: 114/59  Pulse: (!) 56  SpO2: 98%     General:  Appears calm and comfortable Well-nourished no acute distress Eyes:  PERRL, EOMI, normal lids, iris ENT:  grossly normal hearing, lips & tongue, mucous membranes of his mouth are pink and only slightly dry Neck:  no LAD, masses or thyromegaly Cardiovascular:  RRR, no m/r/g. No LE edema. Pedal pulses present and palpable Respiratory:  CTA bilaterally, no w/r/r. Normal respiratory effort. Abdomen:  soft, ntnd, positive bowel sounds no guarding or rebounding Skin:  no rash or induration seen on limited exam Musculoskeletal:  grossly normal tone BUE/BLE, good ROM, no bony abnormality Psychiatric:  grossly normal mood and affect, speech fluent and appropriate, AOx3 Neurologic:  CN 2-12 grossly  intact, moves all extremities in coordinated fashion, sensation intact. Speech clear facial symmetry oriented 3 follows commands some short-term memory  Labs on Admission: I have personally reviewed following labs and imaging studies  CBC:  Recent Labs Lab 11/24/16 1153  WBC 7.7  HGB 13.1  HCT 39.4  MCV 90.6  PLT 123456   Basic Metabolic Panel:  Recent Labs Lab 11/24/16 1153  NA 138  K 4.1  CL 106  CO2 22  GLUCOSE 101*  BUN 20  CREATININE 1.33*  CALCIUM 8.7*   GFR: CrCl cannot be calculated (Unknown ideal weight.). Liver Function Tests: No results for input(s): AST, ALT, ALKPHOS, BILITOT, PROT, ALBUMIN in the last 168 hours. No results for input(s): LIPASE, AMYLASE  in the last 168 hours. No results for input(s): AMMONIA in the last 168 hours. Coagulation Profile: No results for input(s): INR, PROTIME in the last 168 hours. Cardiac Enzymes: No results for input(s): CKTOTAL, CKMB, CKMBINDEX, TROPONINI in the last 168 hours. BNP (last 3 results) No results for input(s): PROBNP in the last 8760 hours. HbA1C: No results for input(s): HGBA1C in the last 72 hours. CBG:  Recent Labs Lab 11/24/16 1226  GLUCAP 90   Lipid Profile: No results for input(s): CHOL, HDL, LDLCALC, TRIG, CHOLHDL, LDLDIRECT in the last 72 hours. Thyroid Function Tests: No results for input(s): TSH, T4TOTAL, FREET4, T3FREE, THYROIDAB in the last 72 hours. Anemia Panel: No results for input(s): VITAMINB12, FOLATE, FERRITIN, TIBC, IRON, RETICCTPCT in the last 72 hours. Urine analysis:    Component Value Date/Time   COLORURINE YELLOW 11/24/2016 1317   APPEARANCEUR HAZY (A) 11/24/2016 1317   LABSPEC 1.026 11/24/2016 1317   PHURINE 5.0 11/24/2016 1317   GLUCOSEU NEGATIVE 11/24/2016 1317   HGBUR NEGATIVE 11/24/2016 1317   BILIRUBINUR NEGATIVE 11/24/2016 1317   KETONESUR NEGATIVE 11/24/2016 1317   PROTEINUR 30 (A) 11/24/2016 1317   UROBILINOGEN 0.2 10/24/2013 1731   NITRITE NEGATIVE 11/24/2016  1317   LEUKOCYTESUR NEGATIVE 11/24/2016 1317    Creatinine Clearance: CrCl cannot be calculated (Unknown ideal weight.).  Sepsis Labs: @LABRCNTIP (procalcitonin:4,lacticidven:4) )No results found for this or any previous visit (from the past 240 hour(s)).   Radiological Exams on Admission: Dg Chest 2 View  Result Date: 11/24/2016 CLINICAL DATA:  Syncope.  History of prostate carcinoma EXAM: CHEST  2 VIEW COMPARISON:  June 16, 2016 FINDINGS: There is no appreciable edema or consolidation. Heart size and pulmonary vascularity are normal. No adenopathy. Pacemaker leads are attached to the right atrium and right ventricle. Foci of calcification are noted in the aorta. There are no blastic or lytic bone lesions. IMPRESSION: No edema or consolidation.  Aortic atherosclerosis. Electronically Signed   By: Lowella Grip III M.D.   On: 11/24/2016 13:38    EKG: Independently reviewed. Sinus rhythm Prolonged PR interval Left bundle branch block No significant change since last tracing Abnormal ekg   Assessment/Plan Principal Problem:   Syncope Active Problems:   Murmur   At risk for diabetes mellitus/borderline diabetes   Complete heart block (HCC)   Pacemaker   Bradycardia   Acute kidney injury ([place])   #1. Syncope. Etiology uncertain. Has had several syncopal episodes in the past now status post demand pacer. presentation sounds like orthostatic hypotension. Heart rate is 56 in the ED. No signs symptoms of infection. No metabolic derangement. No sedating medications Neuro exam benign -Admit to telemetry -check troponin -Gentle IV fluids -2-D echo -Check orthostatic vital signs -interrogate pacer  #2. Acute kidney injury. Creatinine 1.3. Likely related to decreased oral intake -Gentle IV fluids -Hold nephrotoxins -Monitor urine output -Recheck in the morning  #3. Bradycardia. HR 56. Hx of same. EKG sinus rhythm with prolonged PR left bundle branch block. No significant change  from previous. Patient denies chest pain. -interrogate pacer -repeat ekg in am -TSH -no rate altering meds on home med list -if no improvement or worsens consider cards consult otherwise OP follow up with cardiology  #4. Dementia. Appears stable at baseline but family indicate they noticed gradual worsening  -continue aricept -OP follow up  DVT prophylaxis: lovenox  Code Status: full  Family Communication: wife and daughter at bedside  Disposition Plan: home  Consults called: none  Admission status: obs  Radene Gunning MD [facility]  If 7PM-7AM, please contact night-coverage [email or web address] Password [facility]  11/24/2016, 2:53 PM

## 2016-11-24 NOTE — Progress Notes (Signed)
Patient educated about safety and bed alarm during the night. However patient refused to bed on bed alarm. Will continue to round and check on patient.   Anyela Napierkowski, RN

## 2016-11-24 NOTE — ED Triage Notes (Signed)
Patient was at church this AM and when he was walking and started to feel faint, sat down and experienced LOC. sts it has happened 5 times in the last 10 years. A/O x4 and no reports of pain. On demand pacemaker and takes ASA every night, and no other real remarkable hy

## 2016-11-24 NOTE — ED Notes (Signed)
Report given.

## 2016-11-24 NOTE — ED Notes (Signed)
Spoke with [employer] rep about pacemaker. He reports that the device is functioning perfect and no arrhthymias noted.  Contact: for any farther questions  Ronne Binning [phone]

## 2016-11-25 ENCOUNTER — Observation Stay (HOSPITAL_BASED_OUTPATIENT_CLINIC_OR_DEPARTMENT_OTHER): Payer: Medicare Other

## 2016-11-25 ENCOUNTER — Other Ambulatory Visit (HOSPITAL_COMMUNITY): Payer: Medicare Other

## 2016-11-25 DIAGNOSIS — I351 Nonrheumatic aortic (valve) insufficiency: Secondary | ICD-10-CM

## 2016-11-25 DIAGNOSIS — I951 Orthostatic hypotension: Secondary | ICD-10-CM | POA: Diagnosis not present

## 2016-11-25 DIAGNOSIS — N179 Acute kidney failure, unspecified: Secondary | ICD-10-CM | POA: Diagnosis not present

## 2016-11-25 DIAGNOSIS — R55 Syncope and collapse: Secondary | ICD-10-CM | POA: Diagnosis not present

## 2016-11-25 LAB — BASIC METABOLIC PANEL
ANION GAP: 7 (ref 5–15)
BUN: 15 mg/dL (ref 6–20)
CO2: 27 mmol/L (ref 22–32)
Calcium: 8.8 mg/dL — ABNORMAL LOW (ref 8.9–10.3)
Chloride: 106 mmol/L (ref 101–111)
Creatinine, Ser: 1.24 mg/dL (ref 0.61–1.24)
GFR, EST AFRICAN AMERICAN: 58 mL/min — AB (ref >60)
GFR, EST NON AFRICAN AMERICAN: 50 mL/min — AB (ref >60)
Glucose, Bld: 95 mg/dL (ref 65–99)
POTASSIUM: 4.3 mmol/L (ref 3.5–5.1)
Sodium: 140 mmol/L (ref 135–145)

## 2016-11-25 LAB — ECHOCARDIOGRAM COMPLETE
HEIGHTINCHES: 69 in
Weight: 2427.2 oz

## 2016-11-25 LAB — GLUCOSE, CAPILLARY: Glucose-Capillary: 86 mg/dL (ref 65–99)

## 2016-11-25 MED ORDER — PANTOPRAZOLE SODIUM 40 MG PO TBEC
40.0000 mg | DELAYED_RELEASE_TABLET | Freq: Two times a day (BID) | ORAL | Status: DC
  Filled 2016-11-25: qty 1

## 2016-11-25 MED ORDER — PANTOPRAZOLE SODIUM 40 MG PO TBEC: 40.0000 mg | DELAYED_RELEASE_TABLET | Freq: Two times a day (BID) | ORAL | 0 refills | Status: AC

## 2016-11-25 NOTE — Progress Notes (Signed)
Patient with no complaints or concerns during 7pm - 7am shift. Alerted and oriented, slept during the night.   Joell Usman, RN

## 2016-11-25 NOTE — Evaluation (Addendum)
Physical Therapy Evaluation Patient Details Name: Treigh Kishbaugh MRN: [id number] DOB: 11-01-1926 Today's Date: 11/25/2016   History of Present Illness   83 y.o. male with a Past Medical History significant for dementia, hypertension, syncope who presents with syncope  Clinical Impression  Patient mobilizing well, no dizziness or evidence of instability or syncope. Patient independent with all aspects of mobility. No further acute PT needs. VSS thorughout session with HR and saturations.     Follow Up Recommendations No PT follow up    Equipment Recommendations  None recommended by PT    Recommendations for Other Services       Precautions / Restrictions Precautions Precautions: Fall Precaution Comments: presented for syncope      Mobility  Bed Mobility Overal bed mobility: Independent                Transfers Overall transfer level: Independent                  Ambulation/Gait Ambulation/Gait assistance: Independent Ambulation Distance (Feet): 380 Feet Assistive device: None Gait Pattern/deviations: WFL(Within Functional Limits) Gait velocity: WFL   General Gait Details: steady with gait  Stairs Stairs: Yes Stairs assistance: Modified independent (Device/Increase time) Stair Management: One rail Left Number of Stairs: 6 General stair comments: no difficulty  Wheelchair Mobility    Modified Rankin (Stroke Patients Only)       Balance Overall balance assessment: Independent                               Standardized Balance Assessment Standardized Balance Assessment : Dynamic Gait Index   Dynamic Gait Index Level Surface: Normal Change in Gait Speed: Normal Gait with Horizontal Head Turns: Normal Gait with Vertical Head Turns: Mild Impairment Gait and Pivot Turn: Normal Step Over Obstacle: Mild Impairment Step Around Obstacles: Normal Steps: Mild Impairment Total Score: 21       Pertinent Vitals/Pain      Home  Living Family/patient expects to be discharged to:: Private residence Living Arrangements: Spouse/significant other Available Help at Discharge: Family Type of Home: House Home Access: Stairs to enter Entrance Stairs-Rails: Can reach both Entrance Stairs-Number of Steps: 4 Home Layout: Two level        Prior Function                 Hand Dominance        Extremity/Trunk Assessment   Upper Extremity Assessment Upper Extremity Assessment: Overall WFL for tasks assessed    Lower Extremity Assessment Lower Extremity Assessment: Overall WFL for tasks assessed       Communication      Cognition Arousal/Alertness: Awake/alert Behavior During Therapy: WFL for tasks assessed/performed Overall Cognitive Status: Within Functional Limits for tasks assessed                      General Comments      Exercises     Assessment/Plan    PT Assessment Patent does not need any further PT services  PT Problem List         PT Treatment Interventions      PT Goals (Current goals can be found in the Care Plan section)  Acute Rehab PT Goals Patient Stated Goal: to go home PT Goal Formulation: All assessment and education complete, DC therapy    Frequency     Barriers to discharge        Co-evaluation  End of Session   Activity Tolerance: Patient tolerated treatment well Patient left: in bed;with call bell/phone within reach;with family/visitor present Nurse Communication: Mobility status PT Visit Diagnosis: History of falling (Z91.81) (syncope)         Time: [id number] PT Time Calculation (min) (ACUTE ONLY): 12 min   Charges:   PT Evaluation $PT Eval Low Complexity: 1 Procedure     PT G Codes:         Duncan Dull 12/14/2016, 11:04 AM Alben Deeds, PT DPT  [phone]     2016-12-14 1100  PT G-Codes **NOT FOR INPATIENT CLASS**  Functional Assessment Tool Used Clinical judgement  Functional Limitation Mobility: Walking  and moving around  Mobility: Walking and Moving Around Current Status [id number]) CH  Mobility: Walking and Moving Around Goal Status [id number]) CH  Mobility: Walking and Moving Around Discharge Status [phone]) [facility]

## 2016-11-25 NOTE — Progress Notes (Signed)
Pt has orders to be discharged. Discharge instructions given and pt has no additional questions at this time. Medication regimen reviewed and pt educated. Pt verbalized understanding and has no additional questions. Telemetry box removed. IV removed and site in good condition. Pt stable and waiting for transportation.   Haniyyah Sakuma RN 

## 2016-11-25 NOTE — Discharge Summary (Signed)
Physician Discharge Summary  Raymond Hinton [id number] DOB: [birth year] DOA: 11/24/2016  PCP: Donnie Coffin (Inactive)  Admit date: 11/24/2016 Discharge date: 11/25/2016   Recommendations for Outpatient Follow-Up:   1. Be sure patient stays hydrated   Discharge Diagnosis:   Principal Problem:   Syncope Active Problems:   Murmur   At risk for diabetes mellitus/borderline diabetes   Complete heart block (HCC)   Pacemaker   Bradycardia   Acute kidney injury [facility])   Discharge disposition:  Home  Discharge Condition: Improved.  Diet recommendation: Low sodium, heart healthy.  Wound care: None.   History of Present Illness:   Raymond Hinton is a very pleasant 83 y.o. male with medical history significant for dementia, CBBB s/p pacer, htn, syncope since to the emergency Department chief complaint of syncope.  Information is obtained from the patient and his family who is at the bedside noting that information from patient may be unreliable due to early dementia. Patient has been in his usual state of health until this morning he was at church he sat down and then stood up quickly (as it was not time to sit) and suddenly felt dizzy and weak and "just tired". He then sat down and reports his next memory is "zap". He awakened several people leaning over him telemetry may need to go the hospital. He resisted. He denies any headache visual disturbances numbness tingling of extremities. He denies any chest pain palpitations shortness of breath diaphoresis nausea vomiting. He denies any dysuria hematuria frequency or urgency. He denies lower extremity edema fever chills or sick contacts or recent travel. Family reports he has had several episodes of syncope over the years go pacemaker approximately a half ago.   Hospital Course by Problem:   Orthostatic hypotension -IVF and repeat orthostatics are negative -patient worked with PT -echo:Left ventricle: Abnormal septal motion. The  cavity size was   normal. There was moderate concentric hypertrophy of the septum.   Systolic function was normal. The estimated ejection fraction was   in the range of 55% to 60%. Doppler parameters are consistent   with abnormal left ventricular relaxation (grade 1 diastolic   dysfunction). - Aortic valve: Calcified non coronary cusp. There was mild   regurgitation. - Mitral valve: Calcified annulus. - Left atrium: The atrium was moderately dilated. - Atrial septum: No defect or patent foramen ovale was identified. - Pulmonary arteries: PA peak pressure: 32 mm Hg (S). -CE negative  Acute kidney injury. Creatinine 1.3. Likely related to decreased oral intake -Gentle IV fluids -improved   Medical Consultants:    None.   Discharge Exam:   Vitals:   11/25/16 0200 11/25/16 0623  BP: 127/84 (!) 146/65  Pulse: 67 69  Resp: 18 16  Temp: 97.9 F (36.6 C) 98.3 F (36.8 C)   Vitals:   11/24/16 1957 11/25/16 0200 11/25/16 0623 11/25/16 0627  BP: (!) 123/53 127/84 (!) 146/65   Pulse: 69 67 69   Resp:  18 16   Temp: 98.3 F (36.8 C) 97.9 F (36.6 C) 98.3 F (36.8 C)   TempSrc: Oral Oral Oral   SpO2: 98% 99%    Weight:    68.8 kg (151 lb 11.2 oz)  Height:        Gen:  NAD   The results of significant diagnostics from this hospitalization (including imaging, microbiology, ancillary and laboratory) are listed below for reference.     Procedures and Diagnostic Studies:   Dg Chest 2 View  Result  Date: 11/24/2016 CLINICAL DATA:  Syncope.  History of prostate carcinoma EXAM: CHEST  2 VIEW COMPARISON:  June 16, 2016 FINDINGS: There is no appreciable edema or consolidation. Heart size and pulmonary vascularity are normal. No adenopathy. Pacemaker leads are attached to the right atrium and right ventricle. Foci of calcification are noted in the aorta. There are no blastic or lytic bone lesions. IMPRESSION: No edema or consolidation.  Aortic atherosclerosis. Electronically  Signed   By: Lowella Grip III M.D.   On: 11/24/2016 13:38     Labs:   Basic Metabolic Panel:  Recent Labs Lab 11/24/16 1153 11/24/16 1555 11/25/16 0416  NA 138  --  140  K 4.1  --  4.3  CL 106  --  106  CO2 22  --  27  GLUCOSE 101*  --  95  BUN 20  --  15  CREATININE 1.33*  --  1.24  CALCIUM 8.7*  --  8.8*  MG  --  2.1  --   PHOS  --  4.1  --    GFR Estimated Creatinine Clearance: 40.8 mL/min (by C-G formula based on SCr of 1.24 mg/dL). Liver Function Tests: No results for input(s): AST, ALT, ALKPHOS, BILITOT, PROT, ALBUMIN in the last 168 hours. No results for input(s): LIPASE, AMYLASE in the last 168 hours. No results for input(s): AMMONIA in the last 168 hours. Coagulation profile No results for input(s): INR, PROTIME in the last 168 hours.  CBC:  Recent Labs Lab 11/24/16 1153  WBC 7.7  HGB 13.1  HCT 39.4  MCV 90.6  PLT 253   Cardiac Enzymes:  Recent Labs Lab 11/24/16 1555  TROPONINI <0.03   BNP: Invalid input(s): POCBNP CBG:  Recent Labs Lab 11/24/16 1226 11/25/16 0621  GLUCAP 90 86   D-Dimer No results for input(s): DDIMER in the last 72 hours. Hgb A1c No results for input(s): HGBA1C in the last 72 hours. Lipid Profile No results for input(s): CHOL, HDL, LDLCALC, TRIG, CHOLHDL, LDLDIRECT in the last 72 hours. Thyroid function studies  Recent Labs  11/24/16 1555  TSH 2.989   Anemia work up No results for input(s): VITAMINB12, FOLATE, FERRITIN, TIBC, IRON, RETICCTPCT in the last 72 hours. Microbiology No results found for this or any previous visit (from the past 240 hour(s)).   Discharge Instructions:   Discharge Instructions    Diet - low sodium heart healthy    Complete by:  As directed    Discharge instructions    Complete by:  As directed    Drink at least 4 bottles of water/day   Increase activity slowly    Complete by:  As directed      Allergies as of 11/25/2016      Reactions   Penicillins Other (See  Comments)   Patient states that it makes him go "crazy" Has patient had a PCN reaction causing immediate rash, facial/tongue/throat swelling, SOB or lightheadedness with hypotension: Yes Has patient had a PCN reaction causing severe rash involving mucus membranes or skin necrosis: No Has patient had a PCN reaction that required hospitalization No Has patient had a PCN reaction occurring within the last 10 years: No If all of the above answers are "NO", then may proceed with Cephalosporin use.   Codeine Other (See Comments)   Patient states that it makes him go "crazy"      Medication List    STOP taking these medications   diphenhydrAMINE 25 mg capsule Commonly known as:  BENADRYL  TAKE these medications   aspirin EC 81 MG tablet Take 81 mg by mouth at bedtime.   cholecalciferol 1000 units tablet Commonly known as:  VITAMIN D Take 1,000 Units by mouth daily.   citalopram 10 MG tablet Commonly known as:  CELEXA Take 10 mg by mouth daily.   donepezil 10 MG tablet Commonly known as:  ARICEPT Take 10 mg by mouth daily.   ICAPS PO Take 1 capsule by mouth daily.   ipratropium 0.03 % nasal spray Commonly known as:  ATROVENT Place 1 spray into the nose 2 (two) times daily.   pantoprazole 40 MG tablet Commonly known as:  PROTONIX Take 1 tablet (40 mg total) by mouth 2 (two) times daily before a meal. What changed:  when to take this   vitamin C 1000 MG tablet Take 1,000 mg by mouth daily.      Follow-up Information    Donnie Coffin.   Specialty:  Family Medicine Contact information: [address] [place] [address]            Time coordinating discharge: 31 min  Signed:  Geradine Girt   [facility] 11/25/2016, 4:46 PM

## 2016-11-29 NOTE — ED Provider Notes (Signed)
[place] DEPT Provider Note   CSN: [id number] Arrival date & time: 11/24/16  1129   (LATE NOTE ENTRY)  History   Chief Complaint Chief Complaint  Patient presents with  . Loss of Consciousness    HPI Raymond Hinton is a 83 y.o. male.  HPI  Patient presents with family members who assist with the history of present illness. The patient himself has some memory loss, but does provide details of the events of today, and his current status. The patient was church, family members, when he was visualized to be pale, described lightheadedness, had an episode of syncope. No substantial trauma. No description of pain either before or after the event. No post event shaking, confusion, postictal-like facies. Currently the patient denies any pain, confusion, lightheadedness, nausea. No recent medication changes. Patient has no history of prior similar events, approximately every 6 months over the past few years. He has history of CAD, arrhythmia, has implanted pacemaker, which was evaluated last week.   Past Medical History:  Diagnosis Date  . Arthritis   . Coronary artery disease   . Hx of cardiovascular stress test    Adenosine Myoview (09/2011):  No ischemia, EF 68%, low risk.  Marland Kitchen Hx of echocardiogram    a. Echo (09/2011):  EF 55-60%, mild MR, mild AI, mild LAE, PASP 35;  b. Echo (10/25/2013): Mild LVH, EF 55-60%, normal wall motion, mild AI, mild MR, PASP 43  . LBBB (left bundle branch block)   . Prostate CA [facility])    radiation seeds    Patient Active Problem List   Diagnosis Date Noted  . Bradycardia 11/24/2016  . Acute kidney injury ([place]) 11/24/2016  . Prostate CA ([place])   . Coronary artery disease   . Complete heart block ([place]) 01/11/2016  . Pacemaker 01/11/2016  . SVT (supraventricular tachycardia) ([place]) 01/12/2015  . At risk for diabetes mellitus/borderline diabetes 10/25/2013  . Syncope 10/24/2013  . Leukocytosis 10/24/2013  . Left bundle branch block 09/04/2011   . Atypical chest pain 09/04/2011  . Murmur 09/04/2011    Past Surgical History:  Procedure Laterality Date  . APPENDECTOMY    . CATARACT EXTRACTION    . EP IMPLANTABLE DEVICE N/A 01/11/2016   Procedure: Pacemaker Implant;  Surgeon: Evans Lance, MD;  Location: [place] [facility];  Service: Cardiovascular;  Laterality: N/A;  . EP IMPLANTABLE DEVICE N/A 01/11/2016   Procedure: Loop Recorder Removal;  Surgeon: Evans Lance, MD;  Location: [place] [facility];  Service: Cardiovascular;  Laterality: N/A;  . LASIK    . LOOP RECORDER IMPLANT N/A 01/12/2015   Procedure: LOOP RECORDER IMPLANT;  Surgeon: Evans Lance, MD;  [facility];  Service: Cardiovascular;  Laterality: N/A;  . RADIOACTIVE SEED IMPLANT    . ROOT CANAL    . TONSILLECTOMY         Home Medications    Prior to Admission medications   Medication Sig Start Date End Date Taking? Authorizing Provider  Ascorbic Acid (VITAMIN C) 1000 MG tablet Take 1,000 mg by mouth daily.   Yes Historical Provider, MD  aspirin EC 81 MG tablet Take 81 mg by mouth at bedtime.    Yes Historical Provider, MD  cholecalciferol (VITAMIN D) 1000 UNITS tablet Take 1,000 Units by mouth daily.   Yes Historical Provider, MD  citalopram (CELEXA) 10 MG tablet Take 10 mg by mouth daily. 04/14/15  Yes Historical Provider, MD  donepezil (ARICEPT) 10 MG tablet Take 10 mg by mouth  daily.  12/31/14  Yes Historical Provider, MD  Multiple Vitamins-Minerals (ICAPS PO) Take 1 capsule by mouth daily.   Yes Historical Provider, MD  ipratropium (ATROVENT) 0.03 % nasal spray Place 1 spray into the nose 2 (two) times daily. 10/14/16   Historical Provider, MD  pantoprazole (PROTONIX) 40 MG tablet Take 1 tablet (40 mg total) by mouth 2 (two) times daily before a meal. 11/25/16   Geradine Girt, DO    Family History Family History  Problem Relation Age of Onset  . Heart attack Mother   . Sudden Cardiac Death Father 20  . Heart attack Sister   . Lung cancer Brother    . Lung cancer Sister   . Colon cancer Sister   . Cancer Sister   . Heart failure Brother     Social History Social History  Substance Use Topics  . Smoking status: Never Smoker  . Smokeless tobacco: Never Used  . Alcohol use No     Allergies   Penicillins and Codeine   Review of Systems Review of Systems  Constitutional:       Per HPI, otherwise negative  HENT:       Per HPI, otherwise negative  Respiratory:       Per HPI, otherwise negative  Cardiovascular:       Per HPI, otherwise negative  Gastrointestinal: Negative for vomiting.  Endocrine:       Negative aside from HPI  Genitourinary:       Neg aside from HPI   Musculoskeletal:       Per HPI, otherwise negative  Skin: Negative.   Neurological: Positive for syncope.  Psychiatric/Behavioral:       Memory loss     Physical Exam Updated Vital Signs BP (!) 146/65 (BP Location: Right Arm)   Pulse 69   Temp 98.3 F (36.8 C) (Oral)   Resp 16   Ht 5\' 9"  (1.753 m)   Wt 151 lb 11.2 oz (68.8 kg) Comment: a scale  SpO2 99%   BMI 22.40 kg/m   Physical Exam  Constitutional: He is oriented to person, place, and time. He appears well-developed. No distress.  HENT:  Head: Normocephalic and atraumatic.  Eyes: Conjunctivae and EOM are normal.  Cardiovascular: Normal rate and regular rhythm.   Pulmonary/Chest: Effort normal. No stridor. No respiratory distress.  Abdominal: He exhibits no distension.  Musculoskeletal: He exhibits no edema.  Neurological: He is alert and oriented to person, place, and time. He displays no atrophy and no tremor. No cranial nerve deficit or sensory deficit. He exhibits normal muscle tone. He displays no seizure activity.  Skin: Skin is warm and dry.  Psychiatric: He has a normal mood and affect. He is slowed. Cognition and memory are impaired.  Nursing note and vitals reviewed.    ED Treatments / Results  Labs (all labs ordered are listed, but only abnormal results are  displayed) Labs Reviewed  BASIC METABOLIC PANEL - Abnormal; Notable for the following:       Result Value   Glucose, Bld 101 (*)    Creatinine, Ser 1.33 (*)    Calcium 8.7 (*)    GFR calc non Af Amer 46 (*)    GFR calc Af Amer 54 (*)    All other components within normal limits  URINALYSIS, ROUTINE W REFLEX MICROSCOPIC - Abnormal; Notable for the following:    APPearance HAZY (*)    Protein, ur 30 (*)    Squamous Epithelial / LPF  0-5 (*)    All other components within normal limits  BASIC METABOLIC PANEL - Abnormal; Notable for the following:    Calcium 8.8 (*)    GFR calc non Af Amer 50 (*)    GFR calc Af Amer 58 (*)    All other components within normal limits  CBC  TSH  MAGNESIUM  PHOSPHORUS  TROPONIN I  GLUCOSE, CAPILLARY  CBG MONITORING, ED    EKG  EKG Interpretation  Date/Time:  Sunday November 24 2016 11:41:07 EST Ventricular Rate:  54 PR Interval:    QRS Duration: 145 QT Interval:  519 QTC Calculation: 492 R Axis:   5 Text Interpretation:  Sinus rhythm Prolonged PR interval Left bundle branch block No significant change since last tracing Abnormal ekg Confirmed by Carmin Muskrat  MD [phone]) on 11/24/2016 11:53:29 AM       On multiple repeat evaluations the patient was in similar condition, no complaints, stating that he felt generally well. I had multiple conversations with the patient's family members, son, daughter, wife regarding today's event, the episodic nature of his syncope.  Chart review notable for recent evaluation of the patient's pacemaker, with findings consistent with appropriate functioning. Slight change in sensitivity.   Procedures Procedures (including critical care time)  Medications Ordered in ED Medications  0.9 %  sodium chloride infusion ( Intravenous Stopped 11/25/16 0000)     Initial Impression / Assessment and Plan / ED Course  I have reviewed the triage vital signs and the nursing notes.  Pertinent labs & imaging  results that were available during my care of the patient were reviewed by me and considered in my medical decision making (see chart for details).   Summary this elderly male with some memory loss presents after episode of syncope. Given the unclear circumstances of the event, the recurrent nature Terrence Dupont and his risk factors, patient was admitted for further evaluation, initial labs generally reassuring, no initial evidence for pacemaker malfunction, infection, neurogenic etiology, some consideration of vasogenic versus occult neurogenic causes.   Final Clinical Impressions(s) / ED Diagnoses  Syncope   Carmin Muskrat, MD 11/29/16 [phone]

## 2016-12-05 NOTE — Progress Notes (Signed)
PT evaluation addendum Visit diagnosis    11/25/16 1100  PT Assessment  PT Recommendation/Assessment Patent does not need any further PT services  PT Visit Diagnosis History of falling (Z91.81) (syncope)  No Skilled PT All education completed;Patient at baseline level of functioning  12/05/2016 [place], [place]

## 2016-12-25 DIAGNOSIS — H21301 Idiopathic cysts of iris, ciliary body or anterior chamber, right eye: Secondary | ICD-10-CM | POA: Diagnosis not present

## 2016-12-25 DIAGNOSIS — Z961 Presence of intraocular lens: Secondary | ICD-10-CM | POA: Diagnosis not present

## 2016-12-31 ENCOUNTER — Ambulatory Visit (INDEPENDENT_AMBULATORY_CARE_PROVIDER_SITE_OTHER): Payer: Medicare Other | Admitting: Internal Medicine

## 2016-12-31 ENCOUNTER — Encounter: Payer: Self-pay | Admitting: Internal Medicine

## 2016-12-31 VITALS — BP 112/64 | HR 68 | Ht 69.0 in | Wt 160.0 lb

## 2016-12-31 DIAGNOSIS — I442 Atrioventricular block, complete: Secondary | ICD-10-CM

## 2016-12-31 NOTE — Progress Notes (Signed)
HPI [name] returns today for ongoing PPM followup.  The patient has had longstanding baseline LBBB. He underwent ILR insertion which demonstrated heart block and he then underwent PPM insertion. Since then, he has passed out 3 times despite his PPM working normally and despite a CLS PPM. He has had little warning and had to go with paramedics to the hospital. He usually feels well after 1-2 hours after the spell. No obvious change in his dementia. Allergies  Allergen Reactions  . Penicillins Other (See Comments)    Patient states that it makes him go "crazy" Has patient had a PCN reaction causing immediate rash, facial/tongue/throat swelling, SOB or lightheadedness with hypotension: Yes Has patient had a PCN reaction causing severe rash involving mucus membranes or skin necrosis: No Has patient had a PCN reaction that required hospitalization No Has patient had a PCN reaction occurring within the last 10 years: No If all of the above answers are "NO", then may proceed with Cephalosporin use.   . Codeine Other (See Comments)    Patient states that it makes him go "crazy"     Current Outpatient Prescriptions  Medication Sig Dispense Refill  . Ascorbic Acid (VITAMIN C) 1000 MG tablet Take 1,000 mg by mouth daily.    Marland Kitchen aspirin EC 81 MG tablet Take 81 mg by mouth at bedtime.     . cholecalciferol (VITAMIN D) 1000 UNITS tablet Take 1,000 Units by mouth daily.    . citalopram (CELEXA) 10 MG tablet Take 10 mg by mouth daily.    Marland Kitchen donepezil (ARICEPT) 10 MG tablet Take 10 mg by mouth daily.   1  . ipratropium (ATROVENT) 0.03 % nasal spray Place 1 spray into the nose 2 (two) times daily.  1  . Multiple Vitamins-Minerals (ICAPS PO) Take 1 capsule by mouth daily.    . pantoprazole (PROTONIX) 40 MG tablet Take 1 tablet (40 mg total) by mouth 2 (two) times daily before a meal. 60 tablet 0   No current facility-administered medications for this visit.      Past Medical History:    Diagnosis Date  . Arthritis   . Coronary artery disease   . Hx of cardiovascular stress test    Adenosine Myoview (09/2011):  No ischemia, EF 68%, low risk.  Marland Kitchen Hx of echocardiogram    a. Echo (09/2011):  EF 55-60%, mild MR, mild AI, mild LAE, PASP 35;  b. Echo (10/25/2013): Mild LVH, EF 55-60%, normal wall motion, mild AI, mild MR, PASP 43  . LBBB (left bundle branch block)   . Prostate CA ([place])    radiation seeds    ROS:   All systems reviewed and negative except as noted in the HPI.   Past Surgical History:  Procedure Laterality Date  . APPENDECTOMY    . CATARACT EXTRACTION    . EP IMPLANTABLE DEVICE N/A 01/11/2016   Procedure: Pacemaker Implant;  Surgeon: Evans Lance, MD;  Location: [place] [facility];  Service: Cardiovascular;  Laterality: N/A;  . EP IMPLANTABLE DEVICE N/A 01/11/2016   Procedure: Loop Recorder Removal;  Surgeon: Evans Lance, MD;  Location: [place] [facility];  Service: Cardiovascular;  Laterality: N/A;  . LASIK    . LOOP RECORDER IMPLANT N/A 01/12/2015   Procedure: LOOP RECORDER IMPLANT;  Surgeon: Evans Lance, MD;  Location: [facility] CATH LAB;  Service: Cardiovascular;  Laterality: N/A;  . RADIOACTIVE SEED IMPLANT    . ROOT CANAL    .  TONSILLECTOMY       Family History  Problem Relation Age of Onset  . Heart attack Mother   . Sudden Cardiac Death Father 42  . Heart attack Sister   . Lung cancer Brother   . Lung cancer Sister   . Colon cancer Sister   . Cancer Sister   . Heart failure Brother      Social History   Social History  . Marital status: Married    Spouse name: N/A  . Number of children: N/A  . Years of education: N/A   Occupational History  . Not on file.   Social History Main Topics  . Smoking status: Never Smoker  . Smokeless tobacco: Never Used  . Alcohol use No  . Drug use: No  . Sexual activity: Not on file   Other Topics Concern  . 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             BP 112/64   Pulse 68   Ht 5\' 9"  (1.753 m)   Wt 160 lb (72.6 kg)   BMI 23.63 kg/m   Physical Exam:  Well appearing elderly man, NAD HEENT: Unremarkable Neck:  6 cm JVD, no thyromegally Lymphatics:  No adenopathy Back:  No CVA tenderness Lungs:  Clear with no wheezes HEART:  Regular rate rhythm, no murmurs, no rubs, no clicks Abd:  soft, positive bowel sounds, no organomegally, no rebound, no guarding Ext:  2 plus pulses, no edema, no cyanosis, no clubbing Skin:  No rashes no nodules Neuro:  CN II through XII intact, motor grossly intact   PM insertion - HIs Biotronik DDD PM is working normally. Will recheck in several months. We increased the sensitivity of his rate response.  Assess/Plan: 1. 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 We have looked for any arrhythmias on his device and found none. We discussed the pathophysiology of both cardiac inhibition as well as vasodepression as to the mechanism of his passing out.  2. HTN - his blood pressure is well controlled. It has been running low. I have asked him to increase his salt and fluid intake. 3. Dementia - this appears to be fairly mild. Will follow. 4. CAD - he denies anginal symptoms. Will follow.  [name].D.

## 2016-12-31 NOTE — Patient Instructions (Addendum)
Medication Instructions:    Your physician recommends that you continue on your current medications as directed. Please refer to the Current Medication list given to you today.  --- If you need a refill on your cardiac medications before your next appointment, please call your pharmacy. ---  Labwork:  None ordered  Testing/Procedures:  None ordered  Follow-Up: Remote monitoring is used to monitor your Pacemaker of ICD from home. This monitoring reduces the number of office visits required to check your device to one time per year. It allows [place] to keep an eye on the functioning of your device to ensure it is working properly. You are scheduled for a device check from home on 04/01/2017. You may send your transmission at any time that day. If you have a wireless device, the transmission will be sent automatically. After your physician reviews your transmission, you will receive a postcard with your next transmission date.   Your physician wants you to follow-up in: 6 months with Dr. Lovena Le.  You will receive a reminder letter in the mail two months in advance. If you don't receive a letter, please call our office to schedule the follow-up appointment.   Thank you for choosing [facility]!!

## 2017-01-02 DIAGNOSIS — I2510 Atherosclerotic heart disease of native coronary artery without angina pectoris: Secondary | ICD-10-CM | POA: Diagnosis not present

## 2017-01-02 DIAGNOSIS — Z8546 Personal history of malignant neoplasm of prostate: Secondary | ICD-10-CM | POA: Diagnosis not present

## 2017-01-02 DIAGNOSIS — R55 Syncope and collapse: Secondary | ICD-10-CM | POA: Diagnosis not present

## 2017-01-02 DIAGNOSIS — I447 Left bundle-branch block, unspecified: Secondary | ICD-10-CM | POA: Diagnosis not present

## 2017-01-02 DIAGNOSIS — I471 Supraventricular tachycardia: Secondary | ICD-10-CM | POA: Diagnosis not present

## 2017-01-02 DIAGNOSIS — Z950 Presence of cardiac pacemaker: Secondary | ICD-10-CM | POA: Diagnosis not present

## 2017-01-02 DIAGNOSIS — I442 Atrioventricular block, complete: Secondary | ICD-10-CM | POA: Diagnosis not present

## 2017-01-02 DIAGNOSIS — Z7982 Long term (current) use of aspirin: Secondary | ICD-10-CM | POA: Diagnosis not present

## 2017-01-02 DIAGNOSIS — F0390 Unspecified dementia without behavioral disturbance: Secondary | ICD-10-CM | POA: Diagnosis not present

## 2017-01-02 DIAGNOSIS — Z9889 Other specified postprocedural states: Secondary | ICD-10-CM | POA: Diagnosis not present

## 2017-01-02 DIAGNOSIS — K219 Gastro-esophageal reflux disease without esophagitis: Secondary | ICD-10-CM | POA: Diagnosis not present

## 2017-01-02 DIAGNOSIS — H21301 Idiopathic cysts of iris, ciliary body or anterior chamber, right eye: Secondary | ICD-10-CM | POA: Diagnosis not present

## 2017-01-02 LAB — CUP PACEART INCLINIC DEVICE CHECK
Date Time Interrogation Session: 20180327144600
Implantable Lead Implant Date: 20170406
Implantable Lead Location: 753859
Implantable Lead Location: 753860
Implantable Lead Serial Number: 49386458
Implantable Lead Serial Number: 49394856
Implantable Pulse Generator Implant Date: 20170406
Lead Channel Impedance Value: 565 Ohm
Lead Channel Pacing Threshold Amplitude: 0.8 V
Lead Channel Pacing Threshold Pulse Width: 0.4 ms
Lead Channel Sensing Intrinsic Amplitude: 0.9 mV
Lead Channel Sensing Intrinsic Amplitude: 11.3 mV
Lead Channel Setting Pacing Amplitude: 2 V
Lead Channel Setting Pacing Pulse Width: 0.4 ms
MDC IDC LEAD IMPLANT DT: 20170406
MDC IDC MSMT LEADCHNL RA IMPEDANCE VALUE: 448 Ohm
MDC IDC MSMT LEADCHNL RA PACING THRESHOLD PULSEWIDTH: 0.4 ms
MDC IDC MSMT LEADCHNL RV PACING THRESHOLD AMPLITUDE: 1 V
MDC IDC PG SERIAL: 68723375
MDC IDC SET LEADCHNL RV PACING AMPLITUDE: 2.4 V
MDC IDC STAT BRADY RA PERCENT PACED: 40 %
MDC IDC STAT BRADY RV PERCENT PACED: 0 %
Pulse Gen Model: 394969

## 2017-01-06 DIAGNOSIS — Z961 Presence of intraocular lens: Secondary | ICD-10-CM | POA: Diagnosis not present

## 2017-01-06 DIAGNOSIS — H21301 Idiopathic cysts of iris, ciliary body or anterior chamber, right eye: Secondary | ICD-10-CM | POA: Diagnosis not present

## 2017-01-06 DIAGNOSIS — Z79899 Other long term (current) drug therapy: Secondary | ICD-10-CM | POA: Diagnosis not present

## 2017-01-06 DIAGNOSIS — Z7982 Long term (current) use of aspirin: Secondary | ICD-10-CM | POA: Diagnosis not present

## 2017-01-06 DIAGNOSIS — Z880 Allergy status to penicillin: Secondary | ICD-10-CM | POA: Diagnosis not present

## 2017-01-06 DIAGNOSIS — Z885 Allergy status to narcotic agent status: Secondary | ICD-10-CM | POA: Diagnosis not present

## 2017-01-06 DIAGNOSIS — I2510 Atherosclerotic heart disease of native coronary artery without angina pectoris: Secondary | ICD-10-CM | POA: Diagnosis not present

## 2017-01-06 DIAGNOSIS — Z9889 Other specified postprocedural states: Secondary | ICD-10-CM | POA: Diagnosis not present

## 2017-01-19 IMAGING — RF DG ESOPHAGUS
11 of 13 series · 19 of 24 positions shown · non-contrast
Comparison: None.

CLINICAL DATA: Dysphagia

EXAM:
ESOPHOGRAM/BARIUM SWALLOW
TECHNIQUE: Single contrast examination was performed using  thin Aiya Duri.
FLUOROSCOPY TIME:  Radiation Exposure Index (as provided by the
fluoroscopic device): 47 Gy per sq cm
If the device does not provide the exposure index:
Fluoroscopy Time:  1 minutes 42 seconds
Number of Acquired Images:

[Series 1: run · 5 of 11 slices shown (1 of 11)]
[im 1/11]
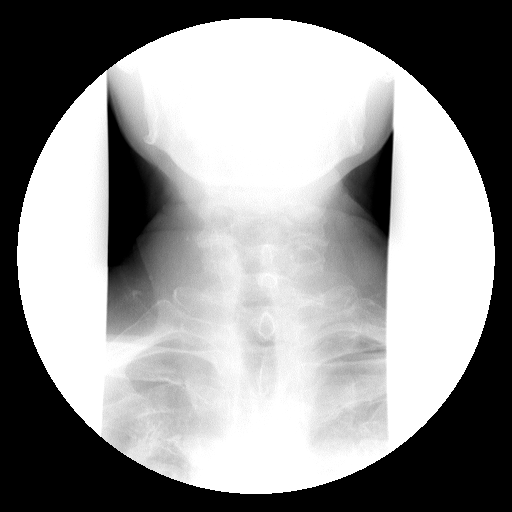
[im 3/11]
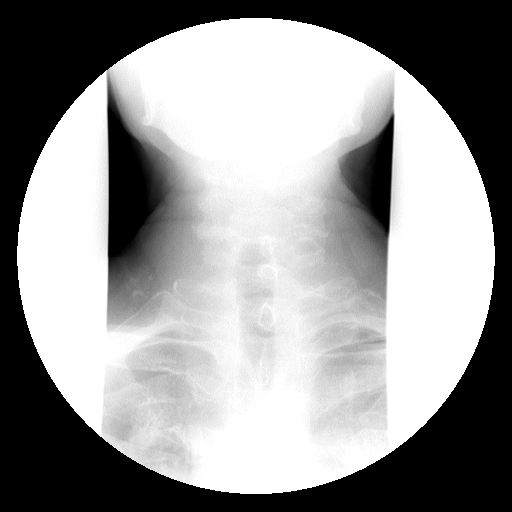
[im 7/11]
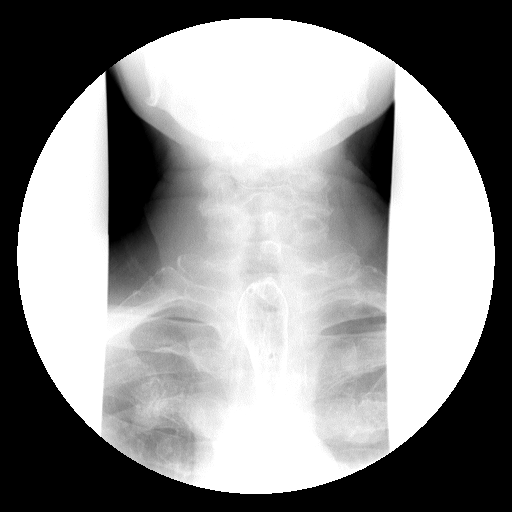
[im 9/11]
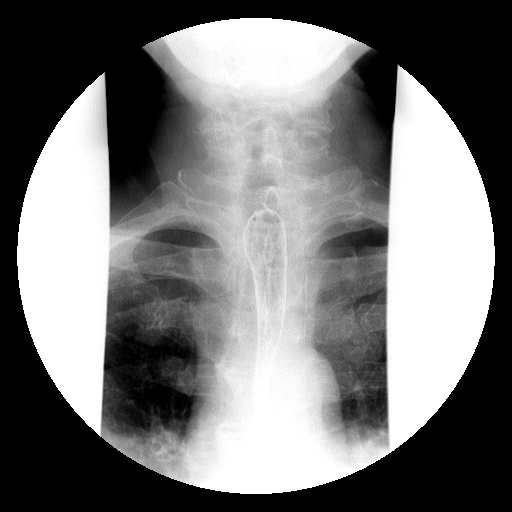
[im 11/11]
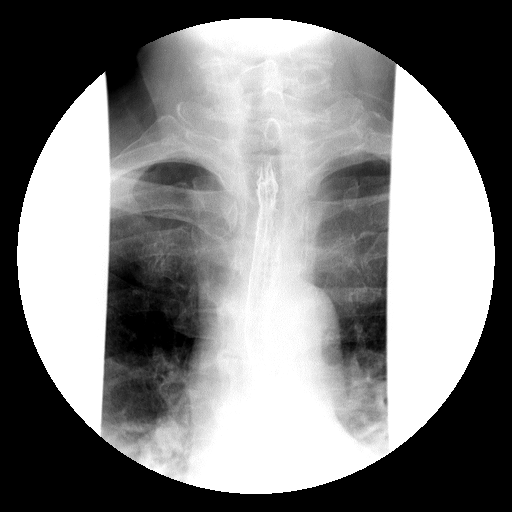

[Series 2: run · 5 of 11 slices shown (2 of 11)]
[im 1/11]
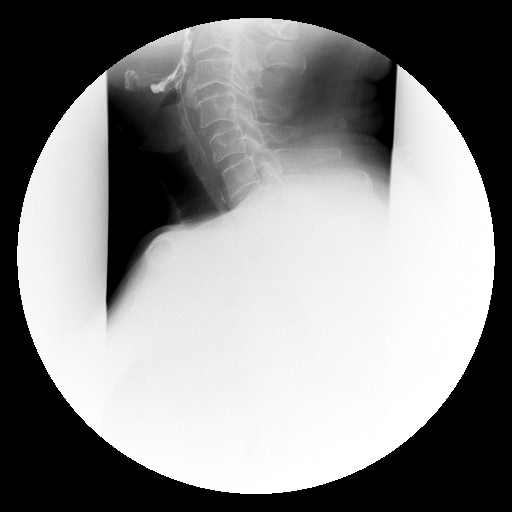
[im 4/11]
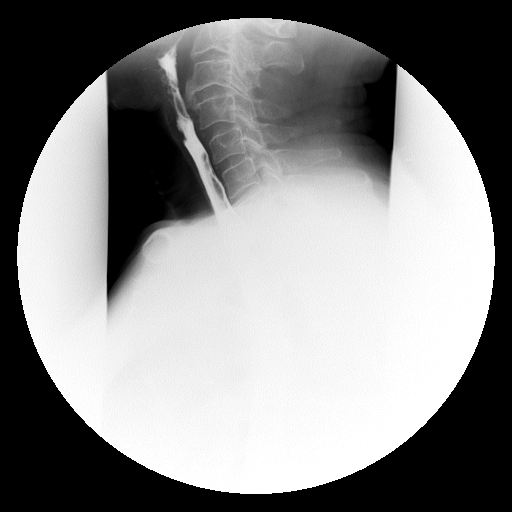
[im 6/11]
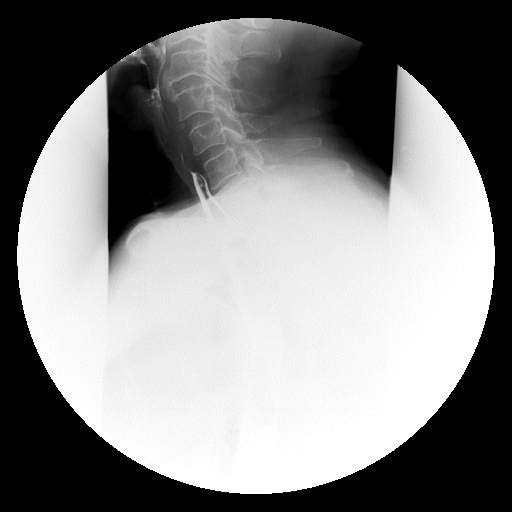
[im 7/11]
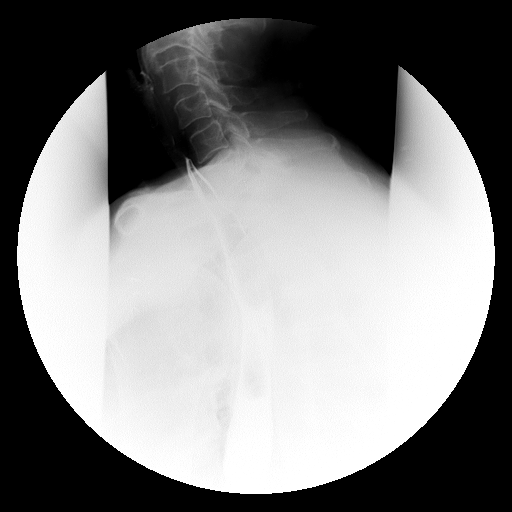
[im 11/11]
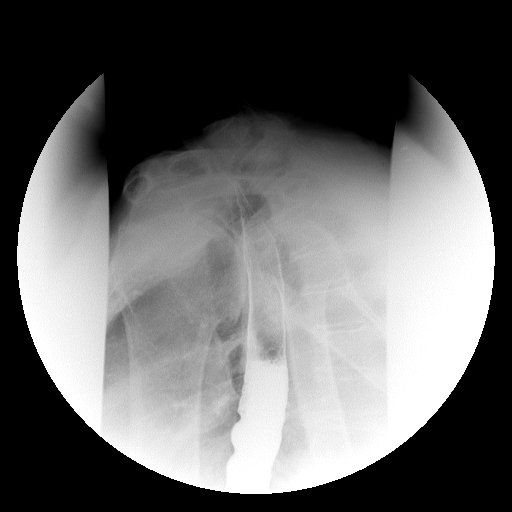

[Series 3: run · 1 of 1 slices shown (3 of 11)]
[im 1/1]
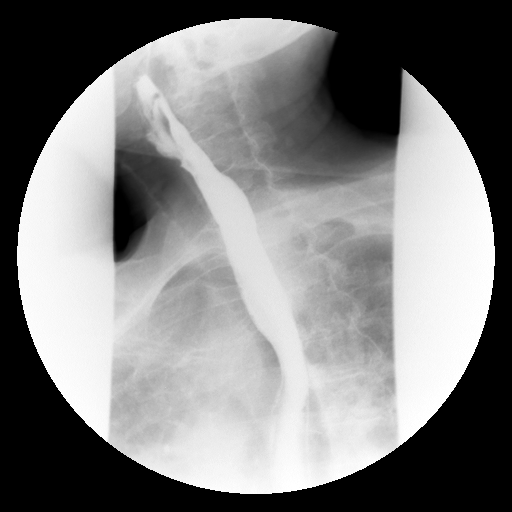

[Series 5: run · 1 of 1 slices shown (4 of 11)]
[im 1/1]
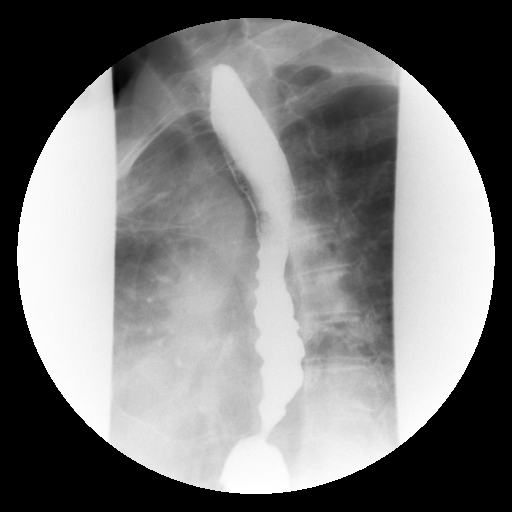

[Series 6: run · 1 of 1 slices shown (5 of 11)]
[im 1/1]
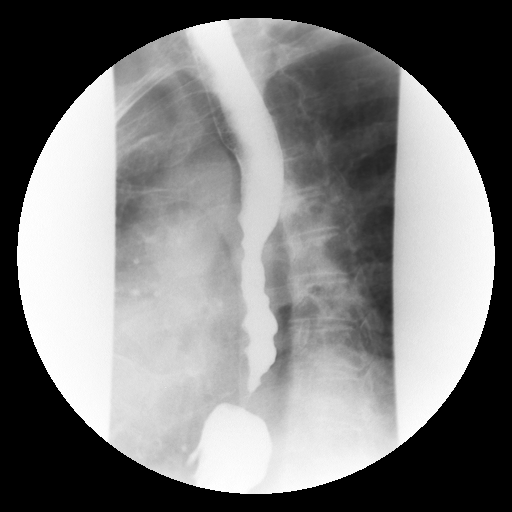

[Series 8: run · 1 of 1 slices shown (6 of 11)]
[im 1/1]
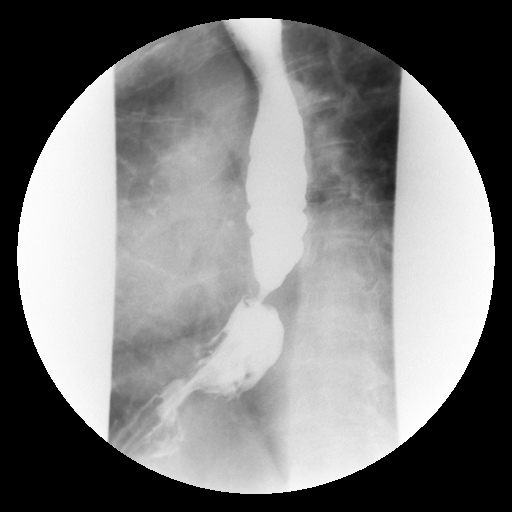

[Series 9: run · 1 of 1 slices shown (7 of 11)]
[im 1/1]
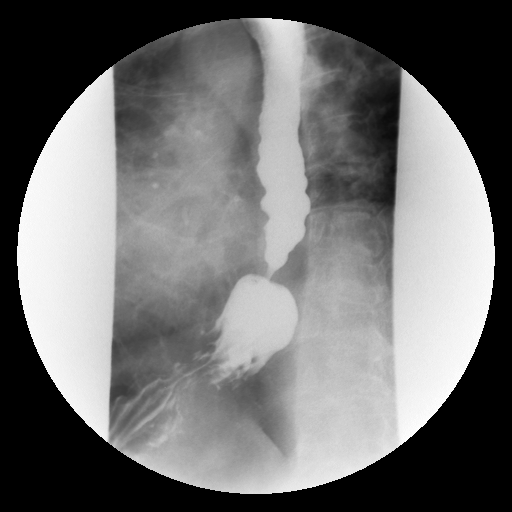

[Series 11: run · 1 of 1 slices shown (8 of 11)]
[im 1/1]
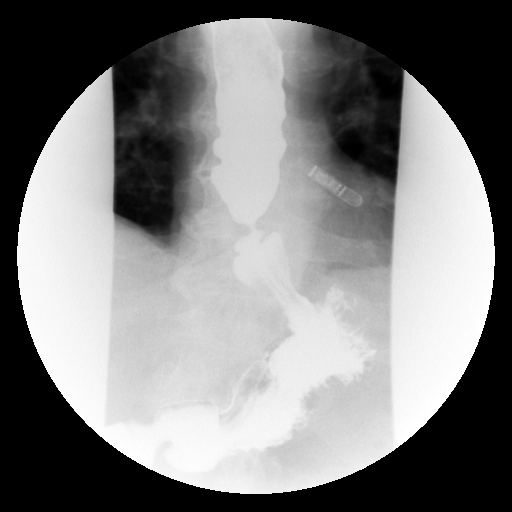

[Series 12: run · 1 of 1 slices shown (9 of 11)]
[im 1/1]
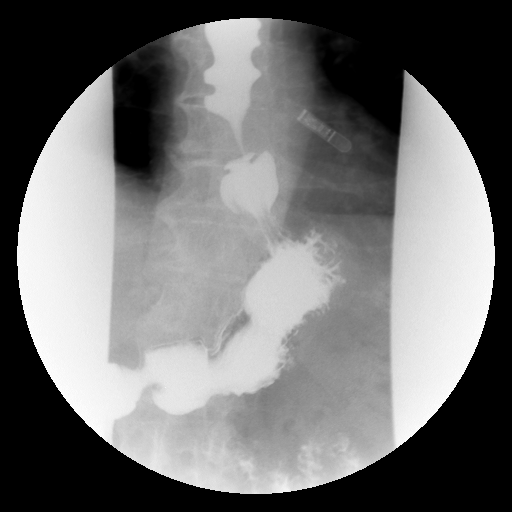

[Series 14: run · 1 of 1 slices shown (10 of 11)]
[im 1/1]
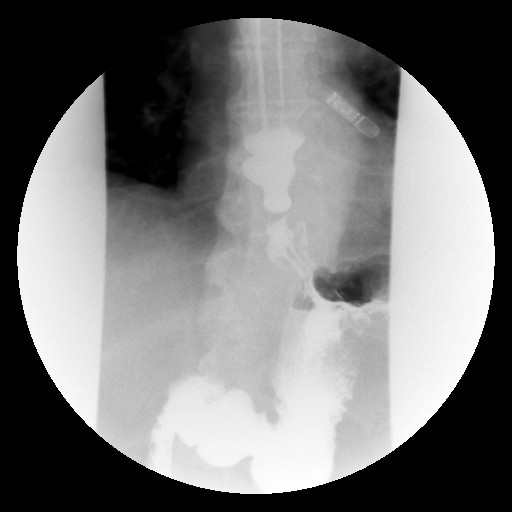

[Series 15: run · 1 of 1 slices shown (11 of 11)]
[im 1/1]
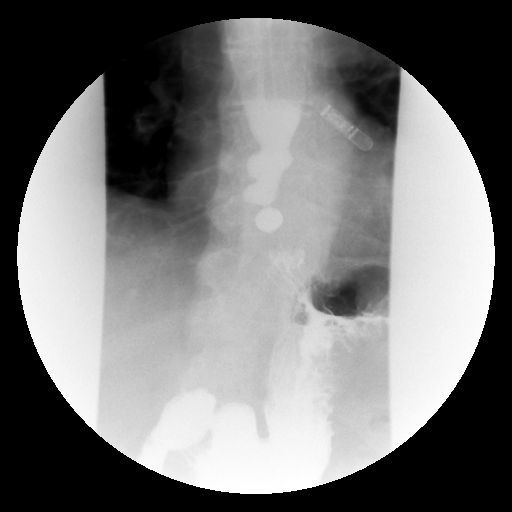

[19 of 24 positions shown; findings below may reference images not displayed]

FINDINGS: Initially rapid sequence spot films of the cervical esophagus were
obtained in the erect position. The swallowing mechanism is
unremarkable. No aspiration is seen. In the RAO position esophageal
peristalsis was evaluated. Moderate tertiary contractions are
present in the mid and distal esophagus. There is a small hiatal
hernia noted. In addition, there is a short segment stricture just
above the hiatal hernia within the distal esophagus where the barium
pill lodges and does not pass. No definite gastroesophageal reflux
could be demonstrated, but the stricture is rather tight which may
prevent reflux.
IMPRESSION: 1. Small hiatal hernia.
2. Short segment tight distal esophageal stricture where the barium
pill lodges and does not pass. No definite gastroesophageal reflux
could be demonstrated.
3. Moderate tertiary contractions.

## 2017-03-05 DIAGNOSIS — Z961 Presence of intraocular lens: Secondary | ICD-10-CM | POA: Diagnosis not present

## 2017-03-05 DIAGNOSIS — H21301 Idiopathic cysts of iris, ciliary body or anterior chamber, right eye: Secondary | ICD-10-CM | POA: Diagnosis not present

## 2017-03-05 DIAGNOSIS — H1820 Unspecified corneal edema: Secondary | ICD-10-CM | POA: Diagnosis not present

## 2017-04-01 ENCOUNTER — Ambulatory Visit (INDEPENDENT_AMBULATORY_CARE_PROVIDER_SITE_OTHER): Payer: Medicare Other | Admitting: *Deleted

## 2017-04-01 DIAGNOSIS — I442 Atrioventricular block, complete: Secondary | ICD-10-CM | POA: Diagnosis not present

## 2017-04-01 NOTE — Progress Notes (Signed)
Remote pacemaker transmission.   

## 2017-04-02 ENCOUNTER — Encounter: Payer: Self-pay | Admitting: Cardiology

## 2017-04-14 DIAGNOSIS — M150 Primary generalized (osteo)arthritis: Secondary | ICD-10-CM | POA: Diagnosis not present

## 2017-04-14 DIAGNOSIS — K219 Gastro-esophageal reflux disease without esophagitis: Secondary | ICD-10-CM | POA: Diagnosis not present

## 2017-04-14 DIAGNOSIS — F320 Major depressive disorder, single episode, mild: Secondary | ICD-10-CM | POA: Diagnosis not present

## 2017-04-14 DIAGNOSIS — R413 Other amnesia: Secondary | ICD-10-CM | POA: Diagnosis not present

## 2017-04-16 ENCOUNTER — Encounter: Payer: Self-pay | Admitting: Cardiology

## 2017-04-29 LAB — CUP PACEART REMOTE DEVICE CHECK
Date Time Interrogation Session: 20180724081244
Implantable Lead Implant Date: 20170406
Implantable Lead Implant Date: 20170406
Implantable Lead Location: 753860
Implantable Lead Model: 377
Implantable Lead Model: 377
Implantable Lead Serial Number: 49386458
MDC IDC LEAD LOCATION: 753859
MDC IDC LEAD SERIAL: 49394856
MDC IDC PG IMPLANT DT: 20170406
MDC IDC SET LEADCHNL RA PACING AMPLITUDE: 2 V
MDC IDC SET LEADCHNL RV PACING AMPLITUDE: 2.4 V
MDC IDC SET LEADCHNL RV PACING PULSEWIDTH: 0.4 ms
Pulse Gen Serial Number: 68723375

## 2017-05-23 DIAGNOSIS — N179 Acute kidney failure, unspecified: Secondary | ICD-10-CM | POA: Diagnosis not present

## 2017-05-23 DIAGNOSIS — I471 Supraventricular tachycardia: Secondary | ICD-10-CM | POA: Diagnosis not present

## 2017-05-23 DIAGNOSIS — I442 Atrioventricular block, complete: Secondary | ICD-10-CM | POA: Diagnosis not present

## 2017-05-23 DIAGNOSIS — C61 Malignant neoplasm of prostate: Secondary | ICD-10-CM | POA: Diagnosis not present

## 2017-05-28 DIAGNOSIS — L570 Actinic keratosis: Secondary | ICD-10-CM | POA: Diagnosis not present

## 2017-05-28 DIAGNOSIS — Z1283 Encounter for screening for malignant neoplasm of skin: Secondary | ICD-10-CM | POA: Diagnosis not present

## 2017-05-28 DIAGNOSIS — X32XXXD Exposure to sunlight, subsequent encounter: Secondary | ICD-10-CM | POA: Diagnosis not present

## 2017-05-28 DIAGNOSIS — L821 Other seborrheic keratosis: Secondary | ICD-10-CM | POA: Diagnosis not present

## 2017-06-04 DIAGNOSIS — Z961 Presence of intraocular lens: Secondary | ICD-10-CM | POA: Diagnosis not present

## 2017-06-04 DIAGNOSIS — H21301 Idiopathic cysts of iris, ciliary body or anterior chamber, right eye: Secondary | ICD-10-CM | POA: Diagnosis not present

## 2017-06-20 ENCOUNTER — Encounter: Payer: Self-pay | Admitting: Internal Medicine

## 2017-07-01 ENCOUNTER — Ambulatory Visit (INDEPENDENT_AMBULATORY_CARE_PROVIDER_SITE_OTHER): Payer: Medicare Other | Admitting: Internal Medicine

## 2017-07-01 ENCOUNTER — Ambulatory Visit (INDEPENDENT_AMBULATORY_CARE_PROVIDER_SITE_OTHER): Payer: Medicare Other | Admitting: *Deleted

## 2017-07-01 ENCOUNTER — Encounter: Payer: Self-pay | Admitting: Internal Medicine

## 2017-07-01 VITALS — BP 118/54 | HR 65 | Ht 69.0 in | Wt 154.0 lb

## 2017-07-01 DIAGNOSIS — I442 Atrioventricular block, complete: Secondary | ICD-10-CM

## 2017-07-01 DIAGNOSIS — R55 Syncope and collapse: Secondary | ICD-10-CM | POA: Diagnosis not present

## 2017-07-01 LAB — CUP PACEART INCLINIC DEVICE CHECK
Brady Statistic RA Percent Paced: 42 %
Brady Statistic RV Percent Paced: 0 %
Implantable Lead Implant Date: 20170406
Implantable Lead Location: 753859
Implantable Lead Model: 377
Implantable Lead Serial Number: 49386458
Lead Channel Impedance Value: 468 Ohm
Lead Channel Pacing Threshold Amplitude: 0.8 V
Lead Channel Pacing Threshold Amplitude: 0.8 V
Lead Channel Pacing Threshold Amplitude: 1.2 V
Lead Channel Pacing Threshold Amplitude: 1.2 V
Lead Channel Pacing Threshold Pulse Width: 0.4 ms
Lead Channel Sensing Intrinsic Amplitude: 0.9 mV
Lead Channel Sensing Intrinsic Amplitude: 0.9 mV
Lead Channel Sensing Intrinsic Amplitude: 10.9 mV
Lead Channel Sensing Intrinsic Amplitude: 11.1 mV
Lead Channel Setting Pacing Amplitude: 2.4 V
Lead Channel Setting Pacing Pulse Width: 0.4 ms
MDC IDC LEAD IMPLANT DT: 20170406
MDC IDC LEAD LOCATION: 753860
MDC IDC LEAD SERIAL: 49394856
MDC IDC MSMT LEADCHNL RA PACING THRESHOLD PULSEWIDTH: 0.4 ms
MDC IDC MSMT LEADCHNL RA PACING THRESHOLD PULSEWIDTH: 0.4 ms
MDC IDC MSMT LEADCHNL RV IMPEDANCE VALUE: 565 Ohm
MDC IDC MSMT LEADCHNL RV PACING THRESHOLD AMPLITUDE: 1.2 V
MDC IDC MSMT LEADCHNL RV PACING THRESHOLD AMPLITUDE: 1.2 V
MDC IDC MSMT LEADCHNL RV PACING THRESHOLD PULSEWIDTH: 0.4 ms
MDC IDC MSMT LEADCHNL RV PACING THRESHOLD PULSEWIDTH: 0.4 ms
MDC IDC MSMT LEADCHNL RV PACING THRESHOLD PULSEWIDTH: 0.4 ms
MDC IDC PG IMPLANT DT: 20170406
MDC IDC PG SERIAL: 68723375
MDC IDC SESS DTM: 20180925120442
MDC IDC SET LEADCHNL RA PACING AMPLITUDE: 2 V

## 2017-07-01 NOTE — Progress Notes (Signed)
Remote pacemaker transmission.   

## 2017-07-01 NOTE — Patient Instructions (Addendum)
Medication Instructions:  Your physician has recommended you make the following change in your medication:  Stop taking your aspirin.  Labwork: None ordered.  Testing/Procedures: None ordered.  Follow-Up: Your physician wants you to follow-up in: one year with Dr. Lovena Le.   You will receive a reminder letter in the mail two months in advance. If you don't receive a letter, please call our office to schedule the follow-up appointment.  Remote monitoring is used to monitor your Pacemaker from home. This monitoring reduces the number of office visits required to check your device to one time per year. It allows [place] to keep an eye on the functioning of your device to ensure it is working properly. You are scheduled for a device check from home on 10/01/2017. You may send your transmission at any time that day. If you have a wireless device, the transmission will be sent automatically. After your physician reviews your transmission, you will receive a postcard with your next transmission date.    Any Other Special Instructions Will Be Listed Below (If Applicable).     If you need a refill on your cardiac medications before your next appointment, please call your pharmacy.

## 2017-07-01 NOTE — Progress Notes (Signed)
HPI Mr. Keatts returns today for ongoing evaluation and management of syncope and symptomatic bradycardia status post pacemaker insertion. The patient has a history of left bundle branch block, and was found to have long pauses after insertion of an implantable loop recorder. He underwent dual-chamber pacemaker insertion and since then has continued to have occasional syncopal episodes. It is thought that he has a combination of both cardiac inhibition as well as vaso-depression. Since I saw him last, he has tried to increase his hydration, and has not passed out. Allergies  Allergen Reactions  . Penicillins Other (See Comments)    Patient states that it makes him go "crazy" Has patient had a PCN reaction causing immediate rash, facial/tongue/throat swelling, SOB or lightheadedness with hypotension: Yes Has patient had a PCN reaction causing severe rash involving mucus membranes or skin necrosis: No Has patient had a PCN reaction that required hospitalization No Has patient had a PCN reaction occurring within the last 10 years: No If all of the above answers are "NO", then may proceed with Cephalosporin use.   . Codeine Other (See Comments)    Patient states that it makes him go "crazy"     Current Outpatient Prescriptions  Medication Sig Dispense Refill  . Ascorbic Acid (VITAMIN C) 1000 MG tablet Take 1,000 mg by mouth daily.    . cholecalciferol (VITAMIN D) 1000 UNITS tablet Take 1,000 Units by mouth daily.    . citalopram (CELEXA) 10 MG tablet Take 10 mg by mouth daily.    Marland Kitchen donepezil (ARICEPT) 10 MG tablet Take 10 mg by mouth daily.   1  . ibuprofen (ADVIL,MOTRIN) 200 MG tablet Take 200-400 mg by mouth as needed.    Marland Kitchen ipratropium (ATROVENT) 0.03 % nasal spray Place 1 spray into the nose 2 (two) times daily.  1  . Multiple Vitamins-Minerals (ICAPS PO) Take 1 capsule by mouth daily.    . pantoprazole (PROTONIX) 40 MG tablet Take 1 tablet (40 mg total) by mouth 2 (two) times daily  before a meal. 60 tablet 0   No current facility-administered medications for this visit.      Past Medical History:  Diagnosis Date  . Arthritis   . Coronary artery disease   . Hx of cardiovascular stress test    Adenosine Myoview (09/2011):  No ischemia, EF 68%, low risk.  Marland Kitchen Hx of echocardiogram    a. Echo (09/2011):  EF 55-60%, mild MR, mild AI, mild LAE, PASP 35;  b. Echo (10/25/2013): Mild LVH, EF 55-60%, normal wall motion, mild AI, mild MR, PASP 43  . LBBB (left bundle branch block)   . Prostate CA ([place])    radiation seeds    ROS:   All systems reviewed and negative except as noted in the HPI.   Past Surgical History:  Procedure Laterality Date  . APPENDECTOMY    . CATARACT EXTRACTION    . EP IMPLANTABLE DEVICE N/A 01/11/2016   Procedure: Pacemaker Implant;  Surgeon: Evans Lance, MD;  Location: [place] [facility];  Service: Cardiovascular;  Laterality: N/A;  . EP IMPLANTABLE DEVICE N/A 01/11/2016   Procedure: Loop Recorder Removal;  Surgeon: Evans Lance, MD;  Location: [place] [facility];  Service: Cardiovascular;  Laterality: N/A;  . LASIK    . LOOP RECORDER IMPLANT N/A 01/12/2015   Procedure: LOOP RECORDER IMPLANT;  Surgeon: Evans Lance, MD;  Location: [facility] CATH LAB;  Service: Cardiovascular;  Laterality: N/A;  . RADIOACTIVE SEED  IMPLANT    . ROOT CANAL    . TONSILLECTOMY       Family History  Problem Relation Age of Onset  . Heart attack Mother   . Sudden Cardiac Death Father 68  . Heart attack Sister   . Lung cancer Brother   . Lung cancer Sister   . Colon cancer Sister   . Cancer Sister   . Heart failure Brother      Social History   Social History  . Marital status: Married    Spouse name: N/A  . Number of children: N/A  . Years of education: N/A   Occupational History  . Not on file.   Social History Main Topics  . Smoking status: Never Smoker  . Smokeless tobacco: Never Used  . Alcohol use No  . Drug use: No  . Sexual activity: Not  on file   Other Topics Concern  . 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             BP (!) 118/54   Pulse 65   Ht 5\' 9"  (1.753 m)   Wt 154 lb (69.9 kg)   SpO2 97%   BMI 22.74 kg/m   Physical Exam:  Well appearingElderly man, 6 cm NAD HEENT: Unremarkable Neck:   JVD, no thyromegally Lymphatics:  No adenopathy Back:  No CVA tenderness Lungs:  Clear, with no wheezes, rales, or rhonchi. Well-healed pacemaker incision  HEART:  Regular rate rhythm, no murmurs, no rubs, no clicks Abd:  soft, positive bowel sounds, no organomegally, no rebound, no guarding Ext:  2 plus pulses, no edema, no cyanosis, no clubbing Skin:  No rashes no nodules Neuro:  CN II through XII intact, motor grossly intact   DEVICE  Normal device function.  See PaceArt for details.   Assess/Plan: 1. Symptomatic bradycardia due to intermittent heart block  - he is stable status post pacemaker insertion  2. Autonomic dysfunction  - he is done better with regard to syncope as he has increased his fluid intake. I've encouraged him to maintain a well-hydrated state.  3. Pacemaker - his Biotronik dual-chamber pacemaker is working normally. We'll plan to recheck in several months.   Cristopher Peru, M.D.

## 2017-07-03 ENCOUNTER — Encounter: Payer: Self-pay | Admitting: Cardiology

## 2017-07-04 DIAGNOSIS — Z23 Encounter for immunization: Secondary | ICD-10-CM | POA: Diagnosis not present

## 2017-07-04 LAB — CUP PACEART REMOTE DEVICE CHECK
Date Time Interrogation Session: 20180928131947
Implantable Lead Implant Date: 20170406
Implantable Lead Location: 753860
Implantable Lead Serial Number: 49386458
Implantable Lead Serial Number: 49394856
MDC IDC LEAD IMPLANT DT: 20170406
MDC IDC LEAD LOCATION: 753859
MDC IDC PG IMPLANT DT: 20170406
MDC IDC PG SERIAL: 68723375
Pulse Gen Model: 394969

## 2017-07-18 ENCOUNTER — Encounter: Payer: Self-pay | Admitting: Cardiology

## 2017-10-01 ENCOUNTER — Ambulatory Visit (INDEPENDENT_AMBULATORY_CARE_PROVIDER_SITE_OTHER): Payer: Medicare Other | Admitting: *Deleted

## 2017-10-01 DIAGNOSIS — I442 Atrioventricular block, complete: Secondary | ICD-10-CM | POA: Diagnosis not present

## 2017-10-01 NOTE — Progress Notes (Signed)
Remote pacemaker transmission.   

## 2017-10-02 ENCOUNTER — Encounter: Payer: Self-pay | Admitting: Cardiology

## 2017-10-08 DIAGNOSIS — Z961 Presence of intraocular lens: Secondary | ICD-10-CM | POA: Diagnosis not present

## 2017-10-08 DIAGNOSIS — H21301 Idiopathic cysts of iris, ciliary body or anterior chamber, right eye: Secondary | ICD-10-CM | POA: Diagnosis not present

## 2017-10-13 LAB — CUP PACEART REMOTE DEVICE CHECK
Brady Statistic AP VP Percent: 0 %
Brady Statistic AS VP Percent: 0 %
Brady Statistic AS VS Percent: 58 %
Brady Statistic RV Percent Paced: 0 %
Implantable Lead Implant Date: 20170406
Implantable Lead Implant Date: 20170406
Implantable Lead Location: 753860
Implantable Lead Model: 377
Implantable Lead Model: 377
Implantable Lead Serial Number: 49386458
Implantable Lead Serial Number: 49394856
Implantable Pulse Generator Implant Date: 20170406
Lead Channel Impedance Value: 447 Ohm
Lead Channel Impedance Value: 511 Ohm
Lead Channel Pacing Threshold Amplitude: 0.8 V
MDC IDC LEAD LOCATION: 753859
MDC IDC MSMT LEADCHNL RA PACING THRESHOLD PULSEWIDTH: 0.4 ms
MDC IDC MSMT LEADCHNL RV PACING THRESHOLD AMPLITUDE: 1.2 V
MDC IDC MSMT LEADCHNL RV PACING THRESHOLD PULSEWIDTH: 0.4 ms
MDC IDC PG SERIAL: 68723375
MDC IDC SESS DTM: 20190107052450
MDC IDC SET LEADCHNL RA PACING AMPLITUDE: 2 V
MDC IDC SET LEADCHNL RV PACING AMPLITUDE: 2.4 V
MDC IDC SET LEADCHNL RV PACING PULSEWIDTH: 0.4 ms
MDC IDC STAT BRADY AP VS PERCENT: 41 %
MDC IDC STAT BRADY RA PERCENT PACED: 42 %

## 2017-10-28 DIAGNOSIS — M150 Primary generalized (osteo)arthritis: Secondary | ICD-10-CM | POA: Diagnosis not present

## 2017-10-28 DIAGNOSIS — K219 Gastro-esophageal reflux disease without esophagitis: Secondary | ICD-10-CM | POA: Diagnosis not present

## 2017-10-28 DIAGNOSIS — R413 Other amnesia: Secondary | ICD-10-CM | POA: Diagnosis not present

## 2017-10-28 DIAGNOSIS — R197 Diarrhea, unspecified: Secondary | ICD-10-CM | POA: Diagnosis not present

## 2017-10-28 DIAGNOSIS — F320 Major depressive disorder, single episode, mild: Secondary | ICD-10-CM | POA: Diagnosis not present

## 2017-11-25 DIAGNOSIS — D225 Melanocytic nevi of trunk: Secondary | ICD-10-CM | POA: Diagnosis not present

## 2017-11-25 DIAGNOSIS — L821 Other seborrheic keratosis: Secondary | ICD-10-CM | POA: Diagnosis not present

## 2017-11-25 DIAGNOSIS — L570 Actinic keratosis: Secondary | ICD-10-CM | POA: Diagnosis not present

## 2017-11-25 DIAGNOSIS — X32XXXD Exposure to sunlight, subsequent encounter: Secondary | ICD-10-CM | POA: Diagnosis not present

## 2017-11-25 DIAGNOSIS — I781 Nevus, non-neoplastic: Secondary | ICD-10-CM | POA: Diagnosis not present

## 2017-12-31 ENCOUNTER — Ambulatory Visit (INDEPENDENT_AMBULATORY_CARE_PROVIDER_SITE_OTHER): Payer: Medicare Other | Admitting: *Deleted

## 2017-12-31 DIAGNOSIS — I442 Atrioventricular block, complete: Secondary | ICD-10-CM

## 2017-12-31 NOTE — Progress Notes (Signed)
Remote pacemaker transmission.   

## 2018-01-02 ENCOUNTER — Encounter: Payer: Self-pay | Admitting: Cardiology

## 2018-01-11 LAB — CUP PACEART REMOTE DEVICE CHECK
Implantable Lead Implant Date: 20170406
Implantable Lead Implant Date: 20170406
Implantable Lead Location: 753860
Implantable Lead Model: 377
Implantable Lead Model: 377
Implantable Lead Serial Number: 49386458
Implantable Pulse Generator Implant Date: 20170406
MDC IDC LEAD LOCATION: 753859
MDC IDC LEAD SERIAL: 49394856
MDC IDC PG SERIAL: 68723375
MDC IDC SESS DTM: 20190407204032

## 2018-04-01 ENCOUNTER — Ambulatory Visit (INDEPENDENT_AMBULATORY_CARE_PROVIDER_SITE_OTHER): Payer: Medicare Other | Admitting: *Deleted

## 2018-04-01 DIAGNOSIS — I442 Atrioventricular block, complete: Secondary | ICD-10-CM | POA: Diagnosis not present

## 2018-04-01 LAB — CUP PACEART REMOTE DEVICE CHECK
Battery Remaining Percentage: 80 %
Brady Statistic AP VP Percent: 0 %
Brady Statistic RA Percent Paced: 41 %
Brady Statistic RV Percent Paced: 0 %
Implantable Lead Implant Date: 20170406
Implantable Lead Location: 753860
Implantable Lead Model: 377
Implantable Lead Serial Number: 49386458
Implantable Pulse Generator Implant Date: 20170406
Lead Channel Impedance Value: 445 Ohm
Lead Channel Impedance Value: 510 Ohm
Lead Channel Pacing Threshold Amplitude: 0.9 V
Lead Channel Pacing Threshold Pulse Width: 0.4 ms
Lead Channel Pacing Threshold Pulse Width: 0.4 ms
Lead Channel Setting Pacing Amplitude: 2.4 V
Lead Channel Setting Pacing Pulse Width: 0.4 ms
MDC IDC LEAD IMPLANT DT: 20170406
MDC IDC LEAD LOCATION: 753859
MDC IDC LEAD SERIAL: 49394856
MDC IDC MSMT LEADCHNL RV PACING THRESHOLD AMPLITUDE: 1.1 V
MDC IDC PG SERIAL: 68723375
MDC IDC SESS DTM: 20190626045931
MDC IDC SET LEADCHNL RA PACING AMPLITUDE: 2 V
MDC IDC STAT BRADY AP VS PERCENT: 41 %
MDC IDC STAT BRADY AS VP PERCENT: 0 %
MDC IDC STAT BRADY AS VS PERCENT: 59 %

## 2018-04-01 NOTE — Progress Notes (Signed)
Remote pacemaker transmission.   

## 2018-04-02 ENCOUNTER — Encounter: Payer: Self-pay | Admitting: Cardiology

## 2018-05-19 DIAGNOSIS — F320 Major depressive disorder, single episode, mild: Secondary | ICD-10-CM | POA: Diagnosis not present

## 2018-05-19 DIAGNOSIS — M150 Primary generalized (osteo)arthritis: Secondary | ICD-10-CM | POA: Diagnosis not present

## 2018-05-19 DIAGNOSIS — R413 Other amnesia: Secondary | ICD-10-CM | POA: Diagnosis not present

## 2018-05-19 DIAGNOSIS — K219 Gastro-esophageal reflux disease without esophagitis: Secondary | ICD-10-CM | POA: Diagnosis not present

## 2018-05-25 DIAGNOSIS — C61 Malignant neoplasm of prostate: Secondary | ICD-10-CM | POA: Diagnosis not present

## 2018-05-26 DIAGNOSIS — D225 Melanocytic nevi of trunk: Secondary | ICD-10-CM | POA: Diagnosis not present

## 2018-05-26 DIAGNOSIS — Z1283 Encounter for screening for malignant neoplasm of skin: Secondary | ICD-10-CM | POA: Diagnosis not present

## 2018-05-29 DIAGNOSIS — H21301 Idiopathic cysts of iris, ciliary body or anterior chamber, right eye: Secondary | ICD-10-CM | POA: Diagnosis not present

## 2018-05-29 DIAGNOSIS — H1820 Unspecified corneal edema: Secondary | ICD-10-CM | POA: Diagnosis not present

## 2018-05-29 DIAGNOSIS — Z961 Presence of intraocular lens: Secondary | ICD-10-CM | POA: Diagnosis not present

## 2018-06-29 DIAGNOSIS — Z961 Presence of intraocular lens: Secondary | ICD-10-CM | POA: Diagnosis not present

## 2018-06-29 DIAGNOSIS — H1820 Unspecified corneal edema: Secondary | ICD-10-CM | POA: Diagnosis not present

## 2018-06-29 DIAGNOSIS — H21301 Idiopathic cysts of iris, ciliary body or anterior chamber, right eye: Secondary | ICD-10-CM | POA: Diagnosis not present

## 2018-07-01 ENCOUNTER — Ambulatory Visit (INDEPENDENT_AMBULATORY_CARE_PROVIDER_SITE_OTHER): Payer: Medicare Other | Admitting: *Deleted

## 2018-07-01 DIAGNOSIS — I442 Atrioventricular block, complete: Secondary | ICD-10-CM | POA: Diagnosis not present

## 2018-07-01 NOTE — Progress Notes (Signed)
Remote pacemaker transmission.   

## 2018-07-02 ENCOUNTER — Encounter: Payer: Self-pay | Admitting: Internal Medicine

## 2018-07-02 ENCOUNTER — Ambulatory Visit (INDEPENDENT_AMBULATORY_CARE_PROVIDER_SITE_OTHER): Payer: Medicare Other | Admitting: Internal Medicine

## 2018-07-02 ENCOUNTER — Encounter: Payer: Self-pay | Admitting: Cardiology

## 2018-07-02 VITALS — BP 100/58 | HR 68 | Ht 69.0 in | Wt 151.0 lb

## 2018-07-02 DIAGNOSIS — R55 Syncope and collapse: Secondary | ICD-10-CM

## 2018-07-02 DIAGNOSIS — Z950 Presence of cardiac pacemaker: Secondary | ICD-10-CM | POA: Diagnosis not present

## 2018-07-02 DIAGNOSIS — I442 Atrioventricular block, complete: Secondary | ICD-10-CM

## 2018-07-02 NOTE — Patient Instructions (Addendum)
Medication Instructions:  Your physician recommends that you continue on your current medications as directed. Please refer to the Current Medication list given to you today.  EAT MORE SALTY FOOD  Labwork: None ordered.  Testing/Procedures: None ordered.  Follow-Up: Your physician wants you to follow-up in: one year with Dr. Lovena Le.   You will receive a reminder letter in the mail two months in advance. If you don't receive a letter, please call our office to schedule the follow-up appointment.  Remote monitoring is used to monitor your Pacemaker from home. This monitoring reduces the number of office visits required to check your device to one time per year. It allows [place] to keep an eye on the functioning of your device to ensure it is working properly. You are scheduled for a device check from home on 10/01/2018. You may send your transmission at any time that day. If you have a wireless device, the transmission will be sent automatically. After your physician reviews your transmission, you will receive a postcard with your next transmission date.  Any Other Special Instructions Will Be Listed Below (If Applicable).  If you need a refill on your cardiac medications before your next appointment, please call your pharmacy.

## 2018-07-02 NOTE — Progress Notes (Signed)
HPI [name] returns today for followup of autonomic dysfunction, syncope, LBBB, dementia and s/p PPM insertion. He has done well in the interim. He has not had syncope since his last visit. He denies chest pain or sob. He continues to suffer from memory decline. He denies chest pain or sob. No edema. His blood pressure is a little low but asymptomatic. Allergies  Allergen Reactions  . Penicillins Other (See Comments)    Patient states that it makes him go "crazy" Has patient had a PCN reaction causing immediate rash, facial/tongue/throat swelling, SOB or lightheadedness with hypotension: Yes Has patient had a PCN reaction causing severe rash involving mucus membranes or skin necrosis: No Has patient had a PCN reaction that required hospitalization No Has patient had a PCN reaction occurring within the last 10 years: No If all of the above answers are "NO", then may proceed with Cephalosporin use.   . Codeine Other (See Comments)    Patient states that it makes him go "crazy"     Current Outpatient Medications  Medication Sig Dispense Refill  . Ascorbic Acid (VITAMIN C) 1000 MG tablet Take 1,000 mg by mouth daily.    . cholecalciferol (VITAMIN D) 1000 UNITS tablet Take 1,000 Units by mouth daily.    . citalopram (CELEXA) 10 MG tablet Take 10 mg by mouth daily.    Marland Kitchen donepezil (ARICEPT) 10 MG tablet Take 10 mg by mouth daily.   1  . fluticasone (FLONASE) 50 MCG/ACT nasal spray Place 1 spray into both nostrils as needed.    Marland Kitchen ipratropium (ATROVENT) 0.03 % nasal spray Place 1 spray into the nose 2 (two) times daily.  1  . Multiple Vitamins-Minerals (ICAPS PO) Take 1 capsule by mouth daily.    . pantoprazole (PROTONIX) 40 MG tablet Take 1 tablet (40 mg total) by mouth 2 (two) times daily before a meal. 60 tablet 0   No current facility-administered medications for this visit.      Past Medical History:  Diagnosis Date  . Arthritis   . Coronary artery disease   . Hx of  cardiovascular stress test    Adenosine Myoview (09/2011):  No ischemia, EF 68%, low risk.  Marland Kitchen Hx of echocardiogram    a. Echo (09/2011):  EF 55-60%, mild MR, mild AI, mild LAE, PASP 35;  b. Echo (10/25/2013): Mild LVH, EF 55-60%, normal wall motion, mild AI, mild MR, PASP 43  . LBBB (left bundle branch block)   . Prostate CA ([place])    radiation seeds    ROS:   All systems reviewed and negative except as noted in the HPI.   Past Surgical History:  Procedure Laterality Date  . APPENDECTOMY    . CATARACT EXTRACTION    . EP IMPLANTABLE DEVICE N/A 01/11/2016   Procedure: Pacemaker Implant;  Surgeon: Evans Lance, MD;  Location: [facility];  Service: Cardiovascular;  Laterality: N/A;  . EP IMPLANTABLE DEVICE N/A 01/11/2016   Procedure: Loop Recorder Removal;  Surgeon: Evans Lance, MD;  Location: [facility];  Service: Cardiovascular;  Laterality: N/A;  . LASIK    . LOOP RECORDER IMPLANT N/A 01/12/2015   Procedure: LOOP RECORDER IMPLANT;  Surgeon: Evans Lance, MD;  Location: [facility];  Service: Cardiovascular;  Laterality: N/A;  . RADIOACTIVE SEED IMPLANT    . ROOT CANAL    . TONSILLECTOMY       Family History  Problem Relation Age of Onset  .  Heart attack Mother   . Sudden Cardiac Death Father 51  . Heart attack Sister   . Lung cancer Brother   . Lung cancer Sister   . Colon cancer Sister   . Cancer Sister   . Heart failure Brother      Social History   Socioeconomic History  . Marital status: Married    Spouse name: Not on file  . Number of children: Not on file  . Years of education: Not on file  . Highest education level: Not on file  Occupational History  . Not on file  Social Needs  . Financial resource strain: Not on file  . Food insecurity:    Worry: Not on file    Inability: Not on file  . Transportation needs:    Medical: Not on file    Non-medical: Not on file  Tobacco Use  . Smoking status: Never Smoker  . Smokeless tobacco: Never  Used  Substance and Sexual Activity  . Alcohol use: No  . Drug use: No  . Sexual activity: Not on file  Lifestyle  . Physical activity:    Days per week: Not on file    Minutes per session: Not on file  . Stress: Not on file  Relationships  . Social connections:    Talks on phone: Not on file    Gets together: Not on file    Attends religious service: Not on file    Active member of club or organization: Not on file    Attends meetings of clubs or organizations: Not on file    Relationship status: Not on file  . Intimate partner violence:    Fear of current or ex partner: Not on file    Emotionally abused: Not on file    Physically abused: Not on file    Forced sexual activity: Not on file  Other Topics Concern  . 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             BP (!) 100/58   Pulse 68   Ht 5\' 9"  (1.753 m)   Wt 151 lb (68.5 kg)   SpO2 97%   BMI 22.30 kg/m   Physical Exam:  Well appearing NAD HEENT: Unremarkable Neck:  No JVD, no thyromegally Lymphatics:  No adenopathy Back:  No CVA tenderness Lungs:  Clear with no wheezes.  HEART:  Regular rate rhythm, no murmurs, no rubs, no clicks Abd:  soft, positive bowel sounds, no organomegally, no rebound, no guarding Ext:  2 plus pulses, no edema, no cyanosis, no clubbing Skin:  No rashes no nodules Neuro:  CN II through XII intact, motor grossly intact  EKG - NSR with LBBB  DEVICE  Normal device function.  See PaceArt for details.   Assess/Plan: 1. Syncope - multifactorial but none recently. I have asked the patient to increase his salt and fluid intake. 2. PPM - his Biotronik CLS PM is working normally. We will recheck in several months. 3. CAD - he denies anginal symptoms.  4. Dementia - he is on good medical therapy. He is good today.   [name].D.

## 2018-07-10 DIAGNOSIS — Z23 Encounter for immunization: Secondary | ICD-10-CM | POA: Diagnosis not present

## 2018-07-17 ENCOUNTER — Emergency Department (HOSPITAL_COMMUNITY): Payer: Medicare Other

## 2018-07-17 ENCOUNTER — Other Ambulatory Visit: Payer: Self-pay

## 2018-07-17 ENCOUNTER — Emergency Department (HOSPITAL_COMMUNITY)
Admission: EM | Admit: 2018-07-17 | Discharge: 2018-07-17 | Disposition: A | Discharge status: Home / Self Care | Payer: Medicare Other | Attending: Emergency Medicine | Admitting: Emergency Medicine

## 2018-07-17 ENCOUNTER — Encounter (HOSPITAL_COMMUNITY): Payer: Self-pay | Admitting: Emergency Medicine

## 2018-07-17 DIAGNOSIS — I2510 Atherosclerotic heart disease of native coronary artery without angina pectoris: Secondary | ICD-10-CM | POA: Insufficient documentation

## 2018-07-17 DIAGNOSIS — R42 Dizziness and giddiness: Secondary | ICD-10-CM | POA: Diagnosis not present

## 2018-07-17 DIAGNOSIS — Z79899 Other long term (current) drug therapy: Secondary | ICD-10-CM | POA: Insufficient documentation

## 2018-07-17 DIAGNOSIS — I6501 Occlusion and stenosis of right vertebral artery: Secondary | ICD-10-CM | POA: Diagnosis not present

## 2018-07-17 DIAGNOSIS — I6523 Occlusion and stenosis of bilateral carotid arteries: Secondary | ICD-10-CM | POA: Diagnosis not present

## 2018-07-17 DIAGNOSIS — R55 Syncope and collapse: Secondary | ICD-10-CM | POA: Insufficient documentation

## 2018-07-17 DIAGNOSIS — I959 Hypotension, unspecified: Secondary | ICD-10-CM | POA: Diagnosis not present

## 2018-07-17 LAB — BASIC METABOLIC PANEL
ANION GAP: 7 (ref 5–15)
BUN: 18 mg/dL (ref 8–23)
CHLORIDE: 111 mmol/L (ref 98–111)
CO2: 23 mmol/L (ref 22–32)
Calcium: 8.5 mg/dL — ABNORMAL LOW (ref 8.9–10.3)
Creatinine, Ser: 1.44 mg/dL — ABNORMAL HIGH (ref 0.61–1.24)
GFR calc non Af Amer: 42 mL/min — ABNORMAL LOW (ref >60)
GFR, EST AFRICAN AMERICAN: 48 mL/min — AB (ref >60)
Glucose, Bld: 100 mg/dL — ABNORMAL HIGH (ref 70–99)
POTASSIUM: 3.9 mmol/L (ref 3.5–5.1)
SODIUM: 141 mmol/L (ref 135–145)

## 2018-07-17 LAB — CBC
HCT: 38.2 % — ABNORMAL LOW (ref 39.0–52.0)
Hemoglobin: 12.2 g/dL — ABNORMAL LOW (ref 13.0–17.0)
MCH: 29.8 pg (ref 26.0–34.0)
MCHC: 31.9 g/dL (ref 30.0–36.0)
MCV: 93.4 fL (ref 80.0–100.0)
PLATELETS: 237 10*3/uL (ref 150–400)
RBC: 4.09 MIL/uL — ABNORMAL LOW (ref 4.22–5.81)
RDW: 12.6 % (ref 11.5–15.5)
WBC: 7 10*3/uL (ref 4.0–10.5)
nRBC: 0 % (ref 0.0–0.2)

## 2018-07-17 LAB — TROPONIN I: Troponin I: <0.03 ng/mL (ref <0.03)

## 2018-07-17 LAB — CBG MONITORING, ED: GLUCOSE-CAPILLARY: 89 mg/dL (ref 70–99)

## 2018-07-17 MED ORDER — IOPAMIDOL (ISOVUE-370) INJECTION 76%
50.0000 mL | Freq: Once | INTRAVENOUS | Status: AC | PRN
  Administered 2018-07-17: 50 mL via INTRAVENOUS

## 2018-07-17 MED ORDER — SODIUM CHLORIDE 0.9 % IV BOLUS
500.0000 mL | Freq: Once | INTRAVENOUS | Status: AC
  Administered 2018-07-17: 500 mL via INTRAVENOUS

## 2018-07-17 NOTE — ED Notes (Signed)
Pt ambulated to Bathroom with no assistance. Pt stated he did not feel dizzy or weak. Pt ambulated to bathroom and back with no assistance and no problems.

## 2018-07-17 NOTE — ED Provider Notes (Signed)
[facility] EMERGENCY DEPARTMENT Provider Note   CSN: [id number] Arrival date & time: 07/17/18  1132     History   Chief Complaint No chief complaint on file.   HPI Raymond Hinton is a 84 y.o. male.  HPI Patient presents after syncopal episode.  States he was outside today watching a tree getting taken down began to feel lightheaded.  States he began to feel the room spinning around him.  Did pass out.  Has had previous syncopal episodes.  States he is passed out 5 times.  Has a pacemaker for heart block.  States he has not had spinning with his other episodes of syncope.  Recently saw Dr. Lovena Le and has been increasing his sodium intake.  He did eat a good breakfast this morning.  States dizziness is resolved. Past Medical History:  Diagnosis Date  . Arthritis   . Coronary artery disease   . Hx of cardiovascular stress test    Adenosine Myoview (09/2011):  No ischemia, EF 68%, low risk.  Marland Kitchen Hx of echocardiogram    a. Echo (09/2011):  EF 55-60%, mild MR, mild AI, mild LAE, PASP 35;  b. Echo (10/25/2013): Mild LVH, EF 55-60%, normal wall motion, mild AI, mild MR, PASP 43  . LBBB (left bundle branch block)   . Prostate CA [facility])    radiation seeds    Patient Active Problem List   Diagnosis Date Noted  . Bradycardia 11/24/2016  . Acute kidney injury ([place]) 11/24/2016  . Prostate CA ([place])   . Coronary artery disease   . Complete heart block ([place]) 01/11/2016  . Pacemaker 01/11/2016  . SVT (supraventricular tachycardia) ([place]) 01/12/2015  . At risk for diabetes mellitus/borderline diabetes 10/25/2013  . Syncope 10/24/2013  . Leukocytosis 10/24/2013  . Left bundle branch block 09/04/2011  . Atypical chest pain 09/04/2011  . Murmur 09/04/2011    Past Surgical History:  Procedure Laterality Date  . APPENDECTOMY    . CATARACT EXTRACTION    . EP IMPLANTABLE DEVICE N/A 01/11/2016   Procedure: Pacemaker Implant;  Surgeon: Evans Lance, MD;  Location: [facility]  CV LAB;  Service: Cardiovascular;  Laterality: N/A;  . EP IMPLANTABLE DEVICE N/A 01/11/2016   Procedure: Loop Recorder Removal;  Surgeon: Evans Lance, MD;  Location: [facility] LAB;  Service: Cardiovascular;  Laterality: N/A;  . LASIK    . LOOP RECORDER IMPLANT N/A 01/12/2015   Procedure: LOOP RECORDER IMPLANT;  Surgeon: Evans Lance, MD;  Location: [facility] CATH LAB;  Service: Cardiovascular;  Laterality: N/A;  . RADIOACTIVE SEED IMPLANT    . ROOT CANAL    . TONSILLECTOMY          Home Medications    Prior to Admission medications   Medication Sig Start Date End Date Taking? Authorizing Provider  Ascorbic Acid (VITAMIN C) 1000 MG tablet Take 1,000 mg by mouth daily.   Yes [provider]  cholecalciferol (VITAMIN D) 1000 UNITS tablet Take 1,000 Units by mouth daily.   Yes [provider]  citalopram (CELEXA) 10 MG tablet Take 10 mg by mouth daily. 04/14/15  Yes [provider]  donepezil (ARICEPT) 10 MG tablet Take 10 mg by mouth daily.  12/31/14  Yes [provider]  fluticasone (FLONASE) 50 MCG/ACT nasal spray Place 1 spray into both nostrils as needed.   Yes [provider]  Multiple Vitamins-Minerals (ICAPS PO) Take 1 capsule by mouth daily.   Yes [provider]  OVER  THE COUNTER MEDICATION Place 1 drop into the right eye 4 (four) times daily. Patient use eye drops over the counter but don't know the name.   Yes [provider]  OVER THE COUNTER MEDICATION Place 1 drop into the right eye 4 (four) times daily. Patient use over the counter eye ointment but don't know the name.   Yes [provider]  pantoprazole (PROTONIX) 40 MG tablet Take 1 tablet (40 mg total) by mouth 2 (two) times daily before a meal. 11/25/16  Yes Geradine Girt, DO    Family History Family History  Problem Relation Age of Onset  . Heart attack Mother   . Sudden Cardiac Death Father 19  . Heart attack Sister   . Lung cancer Brother   .  Lung cancer Sister   . Colon cancer Sister   . Cancer Sister   . Heart failure Brother     Social History Social History   Tobacco Use  . Smoking status: Never Smoker  . Smokeless tobacco: Never Used  Substance Use Topics  . Alcohol use: No  . Drug use: No     Allergies   Penicillins and Codeine   Review of Systems Review of Systems  Constitutional: Negative for appetite change.  HENT: Negative for congestion.   Respiratory: Negative for shortness of breath.   Cardiovascular: Negative for chest pain.  Gastrointestinal: Negative for abdominal pain.  Genitourinary: Negative for flank pain.  Musculoskeletal: Negative for back pain.  Skin: Negative for rash.  Neurological: Positive for dizziness, syncope and light-headedness.  Psychiatric/Behavioral: Negative for confusion.     Physical Exam Updated Vital Signs BP 128/70   Pulse 83   Temp 97.6 F (36.4 C) (Oral)   Resp 19   Ht 5\' 9"  (1.753 m)   Wt 69.9 kg   SpO2 99%   BMI 22.74 kg/m   Physical Exam  Constitutional: He appears well-developed.  HENT:  Head: Atraumatic.  Eyes: EOM are normal.  Neck: Neck supple.  Cardiovascular: Normal rate.  Pulmonary/Chest: No respiratory distress.  Pacemaker to left chest wall.  Abdominal: There is no tenderness.  Musculoskeletal: He exhibits no tenderness.  Neurological: He is alert.  Skin: Skin is warm. Capillary refill takes less than 2 seconds.  No nystagmus.  Finger-nose intact bilaterally.   ED Treatments / Results  Labs (all labs ordered are listed, but only abnormal results are displayed) Labs Reviewed  BASIC METABOLIC PANEL - Abnormal; Notable for the following components:      Result Value   Glucose, Bld 100 (*)    Creatinine, Ser 1.44 (*)    Calcium 8.5 (*)    GFR calc non Af Amer 42 (*)    GFR calc Af Amer 48 (*)    All other components within normal limits  CBC - Abnormal; Notable for the following components:   RBC 4.09 (*)    Hemoglobin 12.2  (*)    HCT 38.2 (*)    All other components within normal limits  TROPONIN I  URINALYSIS, ROUTINE W REFLEX MICROSCOPIC  CBG MONITORING, ED    EKG EKG Interpretation  Date/Time:  Friday July 17 2018 11:41:01 EDT Ventricular Rate:  58 PR Interval:    QRS Duration: 137 QT Interval:  523 QTC Calculation: 514 R Axis:   86 Text Interpretation:  Sinus rhythm Prolonged PR interval Nonspecific intraventricular conduction delay Borderline repolarization abnormality Borderline ST elevation, anterior leads Baseline wander in lead(s) I III aVR aVL aVF V1 V2  V3 V4 V5 V6 Confirmed by Davonna Belling [phone]) on 07/17/2018 12:20:08 PM   Radiology Ct Angio Head W Or Wo Contrast  Result Date: 07/17/2018 CLINICAL DATA:  Vertigo. Two syncopal episodes today. Personal history of left bundle branch block. EXAM: CT ANGIOGRAPHY HEAD AND NECK TECHNIQUE: Multidetector CT imaging of the head and neck was performed using the standard protocol during bolus administration of intravenous contrast. Multiplanar CT image reconstructions and MIPs were obtained to evaluate the vascular anatomy. Carotid stenosis measurements (when applicable) are obtained utilizing NASCET criteria, using the distal internal carotid diameter as the denominator. CONTRAST:  37mL ISOVUE-370 IOPAMIDOL (ISOVUE-370) INJECTION 76% COMPARISON:  None. FINDINGS: CT HEAD FINDINGS Brain: Mild atrophy and white matter changes are present bilaterally. A remote lacunar infarct is present in the left lentiform nucleus. Insular ribbon is intact bilaterally. No focal cortical lesions are present. Moderate atrophy is present. The ventricles are proportionate to the degree of atrophy. Remote ischemic changes are present in the thalami and brainstem. Cerebellum is unremarkable. Vascular: Atherosclerotic calcifications are present within the cavernous internal carotid arteries and at the dural margin of the left vertebral artery. There is no hyperdense vessel.  Skull: Calvarium is intact. No focal lytic or blastic lesions are present. Sinuses: The paranasal sinuses and mastoid air cells are clear. Orbits: Globes and orbits are within normal limits. Bilateral lens replacements are present. CTA NECK FINDINGS Aortic arch: A 3 vessel arch configuration is present. There is no significant stenosis at the origins of the great vessels. Right carotid system: The right common carotid artery demonstrates mild tortuosity without proximal stenosis. Atherosclerotic changes are present at the bifurcation without a significant stenosis. There is moderate tortuosity of the cervical right ICA. Left carotid system: The left common carotid artery is mildly tortuous. Minimal calcifications are present at the bifurcation. There is no significant stenosis. The cervical left ICA is otherwise normal. Vertebral arteries: Dense calcification and high-grade stenosis is present at the origin of the right vertebral artery. The left vertebral artery is slightly dominant. No tandem stenoses are present in the neck. Skeleton: Degenerative anterolisthesis is present at C3-4, C4-5, and C5-6. No focal lytic or blastic lesions are present. Multilevel facet degenerative changes are greatest on the right at C2-3 and C3-4. Other neck: Soft tissues of the neck are otherwise unremarkable. Upper chest: Lung apices are clear. No focal nodule or mass lesion is present. Review of the MIP images confirms the above findings CTA HEAD FINDINGS Anterior circulation: Atherosclerotic calcifications are present within the cavernous internal carotid arteries bilaterally. There is no focal stenosis of greater than 50% relative to the more distal vessel. ICA termini are within normal limits bilaterally. The A1 and M1 segments are normal. Anterior communicating artery is patent. The MCA bifurcations are within normal limits bilaterally. Moderate stenosis is present in the proximal most anterior right M2 branch. There is some  attenuation of distal MCA branch vessels without a significant proximal stenosis or occlusion. Posterior circulation: The left vertebral artery is the dominant vessel. PICA origins are visualized and normal. Vertebrobasilar junction is normal. Both posterior cerebral arteries originate from the basilar tip. Posterior cerebral arteries are within normal limits bilaterally. Venous sinuses: The dural sinuses are patent. The straight sinus deep cerebral veins are intact. Cortical veins are within normal limits. Anatomic variants: None Delayed phase: Postcontrast imaging through the brain demonstrates no pathologic enhancement. Review of the MIP images confirms the above findings IMPRESSION: 1. No emergent large vessel occlusion. 2. No acute intracranial abnormality.  3. Pre and postcontrast images the brain demonstrate moderate diffuse white matter disease consistent with small vessel ischemia. 4. Atherosclerotic changes at the carotid bifurcations bilaterally without significant stenosis. 5. Tortuosity of the carotid arteries bilaterally in the neck without significant stenosis. 6. More prominent atherosclerotic calcifications within the cavernous internal carotid arteries bilaterally without significant stenosis. 7. Moderate proximal stenosis of the most anterior right M2 branch. 8. No other significant proximal stenosis or occlusion. 9. Mild distal small vessel disease. Electronically Signed   By: San Morelle M.D.   On: 07/17/2018 14:12   Dg Chest 2 View  Result Date: 07/17/2018 CLINICAL DATA:  Syncope, history pacemaker, coronary artery disease, history of arrhythmias, prostate cancer EXAM: CHEST - 2 VIEW COMPARISON:  11/24/2016 FINDINGS: LEFT subclavian transvenous sequential pacemaker with leads projecting at RIGHT atrium and RIGHT ventricle. Upper normal heart size. Mediastinal contours and pulmonary vascularity normal. Lungs clear. Calcified granuloma LEFT upper lobe noted. No acute infiltrate,  pleural effusion or pneumothorax. Bones demineralized. IMPRESSION: No acute abnormalities. Electronically Signed   By: Lavonia Dana M.D.   On: 07/17/2018 13:00   Ct Angio Neck W And/or Wo Contrast  Result Date: 07/17/2018 CLINICAL DATA:  Vertigo. Two syncopal episodes today. Personal history of left bundle branch block. EXAM: CT ANGIOGRAPHY HEAD AND NECK TECHNIQUE: Multidetector CT imaging of the head and neck was performed using the standard protocol during bolus administration of intravenous contrast. Multiplanar CT image reconstructions and MIPs were obtained to evaluate the vascular anatomy. Carotid stenosis measurements (when applicable) are obtained utilizing NASCET criteria, using the distal internal carotid diameter as the denominator. CONTRAST:  77mL ISOVUE-370 IOPAMIDOL (ISOVUE-370) INJECTION 76% COMPARISON:  None. FINDINGS: CT HEAD FINDINGS Brain: Mild atrophy and white matter changes are present bilaterally. A remote lacunar infarct is present in the left lentiform nucleus. Insular ribbon is intact bilaterally. No focal cortical lesions are present. Moderate atrophy is present. The ventricles are proportionate to the degree of atrophy. Remote ischemic changes are present in the thalami and brainstem. Cerebellum is unremarkable. Vascular: Atherosclerotic calcifications are present within the cavernous internal carotid arteries and at the dural margin of the left vertebral artery. There is no hyperdense vessel. Skull: Calvarium is intact. No focal lytic or blastic lesions are present. Sinuses: The paranasal sinuses and mastoid air cells are clear. Orbits: Globes and orbits are within normal limits. Bilateral lens replacements are present. CTA NECK FINDINGS Aortic arch: A 3 vessel arch configuration is present. There is no significant stenosis at the origins of the great vessels. Right carotid system: The right common carotid artery demonstrates mild tortuosity without proximal stenosis. Atherosclerotic  changes are present at the bifurcation without a significant stenosis. There is moderate tortuosity of the cervical right ICA. Left carotid system: The left common carotid artery is mildly tortuous. Minimal calcifications are present at the bifurcation. There is no significant stenosis. The cervical left ICA is otherwise normal. Vertebral arteries: Dense calcification and high-grade stenosis is present at the origin of the right vertebral artery. The left vertebral artery is slightly dominant. No tandem stenoses are present in the neck. Skeleton: Degenerative anterolisthesis is present at C3-4, C4-5, and C5-6. No focal lytic or blastic lesions are present. Multilevel facet degenerative changes are greatest on the right at C2-3 and C3-4. Other neck: Soft tissues of the neck are otherwise unremarkable. Upper chest: Lung apices are clear. No focal nodule or mass lesion is present. Review of the MIP images confirms the above findings CTA HEAD FINDINGS Anterior circulation:  Atherosclerotic calcifications are present within the cavernous internal carotid arteries bilaterally. There is no focal stenosis of greater than 50% relative to the more distal vessel. ICA termini are within normal limits bilaterally. The A1 and M1 segments are normal. Anterior communicating artery is patent. The MCA bifurcations are within normal limits bilaterally. Moderate stenosis is present in the proximal most anterior right M2 branch. There is some attenuation of distal MCA branch vessels without a significant proximal stenosis or occlusion. Posterior circulation: The left vertebral artery is the dominant vessel. PICA origins are visualized and normal. Vertebrobasilar junction is normal. Both posterior cerebral arteries originate from the basilar tip. Posterior cerebral arteries are within normal limits bilaterally. Venous sinuses: The dural sinuses are patent. The straight sinus deep cerebral veins are intact. Cortical veins are within normal  limits. Anatomic variants: None Delayed phase: Postcontrast imaging through the brain demonstrates no pathologic enhancement. Review of the MIP images confirms the above findings IMPRESSION: 1. No emergent large vessel occlusion. 2. No acute intracranial abnormality. 3. Pre and postcontrast images the brain demonstrate moderate diffuse white matter disease consistent with small vessel ischemia. 4. Atherosclerotic changes at the carotid bifurcations bilaterally without significant stenosis. 5. Tortuosity of the carotid arteries bilaterally in the neck without significant stenosis. 6. More prominent atherosclerotic calcifications within the cavernous internal carotid arteries bilaterally without significant stenosis. 7. Moderate proximal stenosis of the most anterior right M2 branch. 8. No other significant proximal stenosis or occlusion. 9. Mild distal small vessel disease. Electronically Signed   By: San Morelle M.D.   On: 07/17/2018 14:12    Procedures Procedures (including critical care time)  Medications Ordered in ED Medications  sodium chloride 0.9 % bolus 500 mL (0 mLs Intravenous Stopped 07/17/18 1512)  iopamidol (ISOVUE-370) 76 % injection 50 mL (50 mLs Intravenous Contrast Given 07/17/18 1344)     Initial Impression / Assessment and Plan / ED Course  I have reviewed the triage vital signs and the nursing notes.  Pertinent labs & imaging results that were available during my care of the patient were reviewed by me and considered in my medical decision making (see chart for details).     Patient with syncope. History of same.  Syncope but had a vertiginous component which he has not had before.  CTA reassuring.  Feels better and is ambulated.  Will discharge home.  Final Clinical Impressions(s) / ED Diagnoses   Final diagnoses:  Syncope, unspecified syncope type    ED Discharge Orders    None       Davonna Belling, MD 07/17/18 1537

## 2018-07-17 NOTE — ED Notes (Signed)
ED Provider at bedside. 

## 2018-07-17 NOTE — ED Triage Notes (Signed)
Patient arrived from home via [facility], due to a syncopal event. Family reports that he did not fall to the ground that they helped him to a bench. EMS witnessed a second syncopal event, patient has a pacemaker, he received 113ml NS Patient is A&O, follows command.

## 2018-07-17 NOTE — ED Notes (Signed)
Patient transported to CT 

## 2018-07-17 NOTE — ED Notes (Signed)
D/c reviewed with patient and family 

## 2018-07-17 NOTE — ED Notes (Signed)
Patient transported to X-ray 

## 2018-07-31 LAB — CUP PACEART REMOTE DEVICE CHECK
Implantable Lead Implant Date: 20170406
Implantable Lead Location: 753859
Implantable Lead Model: 377
Implantable Lead Serial Number: 49386458
Implantable Pulse Generator Implant Date: 20170406
MDC IDC LEAD IMPLANT DT: 20170406
MDC IDC LEAD LOCATION: 753860
MDC IDC LEAD SERIAL: 49394856
MDC IDC SESS DTM: 20191025112344
Pulse Gen Model: 394969
Pulse Gen Serial Number: 68723375

## 2018-07-31 LAB — CUP PACEART INCLINIC DEVICE CHECK
Implantable Lead Implant Date: 20170406
Implantable Lead Implant Date: 20170406
Implantable Lead Location: 753859
Implantable Lead Model: 377
Implantable Lead Serial Number: 49394856
Implantable Pulse Generator Implant Date: 20170406
MDC IDC LEAD LOCATION: 753860
MDC IDC LEAD SERIAL: 49386458
MDC IDC SESS DTM: 20191025153758
Pulse Gen Model: 394969
Pulse Gen Serial Number: 68723375

## 2018-09-09 DIAGNOSIS — H21301 Idiopathic cysts of iris, ciliary body or anterior chamber, right eye: Secondary | ICD-10-CM | POA: Diagnosis not present

## 2018-09-09 DIAGNOSIS — H18231 Secondary corneal edema, right eye: Secondary | ICD-10-CM | POA: Diagnosis not present

## 2018-09-09 DIAGNOSIS — Z961 Presence of intraocular lens: Secondary | ICD-10-CM | POA: Diagnosis not present

## 2018-10-01 ENCOUNTER — Ambulatory Visit (INDEPENDENT_AMBULATORY_CARE_PROVIDER_SITE_OTHER): Payer: Medicare Other

## 2018-10-01 DIAGNOSIS — R55 Syncope and collapse: Secondary | ICD-10-CM

## 2018-10-01 DIAGNOSIS — I442 Atrioventricular block, complete: Secondary | ICD-10-CM | POA: Diagnosis not present

## 2018-10-01 NOTE — Progress Notes (Signed)
Remote pacemaker transmission.   

## 2018-10-02 LAB — CUP PACEART REMOTE DEVICE CHECK
Date Time Interrogation Session: 20191227134654
Implantable Lead Implant Date: 20170406
Implantable Lead Location: 753860
Implantable Lead Model: 377
Implantable Lead Serial Number: 49386458
Implantable Lead Serial Number: 49394856
MDC IDC LEAD IMPLANT DT: 20170406
MDC IDC LEAD LOCATION: 753859
MDC IDC PG IMPLANT DT: 20170406
MDC IDC PG SERIAL: 68723375

## 2018-11-11 DIAGNOSIS — Z961 Presence of intraocular lens: Secondary | ICD-10-CM | POA: Diagnosis not present

## 2018-11-11 DIAGNOSIS — H21301 Idiopathic cysts of iris, ciliary body or anterior chamber, right eye: Secondary | ICD-10-CM | POA: Diagnosis not present

## 2018-11-11 DIAGNOSIS — H18231 Secondary corneal edema, right eye: Secondary | ICD-10-CM | POA: Diagnosis not present

## 2018-11-11 DIAGNOSIS — Z9889 Other specified postprocedural states: Secondary | ICD-10-CM | POA: Diagnosis not present

## 2018-12-02 DIAGNOSIS — K219 Gastro-esophageal reflux disease without esophagitis: Secondary | ICD-10-CM | POA: Diagnosis not present

## 2018-12-02 DIAGNOSIS — R413 Other amnesia: Secondary | ICD-10-CM | POA: Diagnosis not present

## 2018-12-02 DIAGNOSIS — F320 Major depressive disorder, single episode, mild: Secondary | ICD-10-CM | POA: Diagnosis not present

## 2018-12-02 DIAGNOSIS — M150 Primary generalized (osteo)arthritis: Secondary | ICD-10-CM | POA: Diagnosis not present

## 2018-12-02 DIAGNOSIS — J310 Chronic rhinitis: Secondary | ICD-10-CM | POA: Diagnosis not present

## 2018-12-31 ENCOUNTER — Ambulatory Visit (INDEPENDENT_AMBULATORY_CARE_PROVIDER_SITE_OTHER): Payer: Medicare Other | Admitting: *Deleted

## 2018-12-31 ENCOUNTER — Other Ambulatory Visit: Payer: Self-pay

## 2018-12-31 DIAGNOSIS — I442 Atrioventricular block, complete: Secondary | ICD-10-CM

## 2018-12-31 LAB — CUP PACEART REMOTE DEVICE CHECK
Implantable Lead Implant Date: 20170406
Implantable Lead Model: 377
Implantable Lead Model: 377
Implantable Lead Serial Number: 49386458
Implantable Pulse Generator Implant Date: 20170406
MDC IDC LEAD IMPLANT DT: 20170406
MDC IDC LEAD LOCATION: 753859
MDC IDC LEAD LOCATION: 753860
MDC IDC LEAD SERIAL: 49394856
MDC IDC SESS DTM: 20200326154928
Pulse Gen Serial Number: 68723375

## 2019-01-05 ENCOUNTER — Encounter: Payer: Self-pay | Admitting: Cardiology

## 2019-01-05 NOTE — Progress Notes (Signed)
Remote pacemaker transmission.   

## 2019-04-01 ENCOUNTER — Ambulatory Visit (INDEPENDENT_AMBULATORY_CARE_PROVIDER_SITE_OTHER): Payer: Medicare Other | Admitting: *Deleted

## 2019-04-01 DIAGNOSIS — I442 Atrioventricular block, complete: Secondary | ICD-10-CM | POA: Diagnosis not present

## 2019-04-01 LAB — CUP PACEART REMOTE DEVICE CHECK
Date Time Interrogation Session: 20200625095442
Implantable Lead Implant Date: 20170406
Implantable Lead Implant Date: 20170406
Implantable Lead Location: 753859
Implantable Lead Location: 753860
Implantable Lead Model: 377
Implantable Lead Model: 377
Implantable Lead Serial Number: 49386458
Implantable Lead Serial Number: 49394856
Implantable Pulse Generator Implant Date: 20170406
Pulse Gen Model: 394969
Pulse Gen Serial Number: 68723375

## 2019-04-09 ENCOUNTER — Encounter: Payer: Self-pay | Admitting: Cardiology

## 2019-04-09 NOTE — Progress Notes (Signed)
Remote pacemaker transmission.   

## 2019-06-04 DIAGNOSIS — F320 Major depressive disorder, single episode, mild: Secondary | ICD-10-CM | POA: Diagnosis not present

## 2019-06-04 DIAGNOSIS — J310 Chronic rhinitis: Secondary | ICD-10-CM | POA: Diagnosis not present

## 2019-06-04 DIAGNOSIS — R413 Other amnesia: Secondary | ICD-10-CM | POA: Diagnosis not present

## 2019-06-04 DIAGNOSIS — M150 Primary generalized (osteo)arthritis: Secondary | ICD-10-CM | POA: Diagnosis not present

## 2019-06-04 DIAGNOSIS — K219 Gastro-esophageal reflux disease without esophagitis: Secondary | ICD-10-CM | POA: Diagnosis not present

## 2019-06-04 DIAGNOSIS — N183 Chronic kidney disease, stage 3 (moderate): Secondary | ICD-10-CM | POA: Diagnosis not present

## 2019-06-09 DIAGNOSIS — N183 Chronic kidney disease, stage 3 (moderate): Secondary | ICD-10-CM | POA: Diagnosis not present

## 2019-06-09 DIAGNOSIS — Z23 Encounter for immunization: Secondary | ICD-10-CM | POA: Diagnosis not present

## 2019-07-01 ENCOUNTER — Ambulatory Visit (INDEPENDENT_AMBULATORY_CARE_PROVIDER_SITE_OTHER): Payer: Medicare Other | Admitting: *Deleted

## 2019-07-01 DIAGNOSIS — I442 Atrioventricular block, complete: Secondary | ICD-10-CM

## 2019-07-01 LAB — CUP PACEART REMOTE DEVICE CHECK
Battery Remaining Percentage: 75 %
Brady Statistic RA Percent Paced: 37 %
Brady Statistic RV Percent Paced: 0 %
Date Time Interrogation Session: 20200924151102
Implantable Lead Implant Date: 20170406
Implantable Lead Implant Date: 20170406
Implantable Lead Location: 753859
Implantable Lead Location: 753860
Implantable Lead Model: 377
Implantable Lead Model: 377
Implantable Lead Serial Number: 49386458
Implantable Lead Serial Number: 49394856
Implantable Pulse Generator Implant Date: 20170406
Lead Channel Impedance Value: 449 Ohm
Lead Channel Impedance Value: 507 Ohm
Lead Channel Pacing Threshold Amplitude: 1 V
Lead Channel Pacing Threshold Amplitude: 1.2 V
Lead Channel Pacing Threshold Pulse Width: 0.4 ms
Lead Channel Pacing Threshold Pulse Width: 0.4 ms
Lead Channel Sensing Intrinsic Amplitude: 0.8 mV
Lead Channel Sensing Intrinsic Amplitude: 9.7 mV
Lead Channel Setting Pacing Amplitude: 2 V
Lead Channel Setting Pacing Amplitude: 2.4 V
Lead Channel Setting Pacing Pulse Width: 0.4 ms
Pulse Gen Model: 394969
Pulse Gen Serial Number: 68723375

## 2019-07-08 ENCOUNTER — Encounter: Payer: Self-pay | Admitting: Cardiology

## 2019-07-08 NOTE — Progress Notes (Signed)
Remote pacemaker transmission.   

## 2019-08-10 ENCOUNTER — Ambulatory Visit (INDEPENDENT_AMBULATORY_CARE_PROVIDER_SITE_OTHER): Payer: Medicare Other | Admitting: Internal Medicine

## 2019-08-10 ENCOUNTER — Encounter: Payer: Self-pay | Admitting: Internal Medicine

## 2019-08-10 ENCOUNTER — Other Ambulatory Visit: Payer: Self-pay

## 2019-08-10 VITALS — BP 110/54 | HR 74 | Ht 69.0 in | Wt 148.0 lb

## 2019-08-10 DIAGNOSIS — I442 Atrioventricular block, complete: Secondary | ICD-10-CM | POA: Diagnosis not present

## 2019-08-10 DIAGNOSIS — Z950 Presence of cardiac pacemaker: Secondary | ICD-10-CM

## 2019-08-10 DIAGNOSIS — R55 Syncope and collapse: Secondary | ICD-10-CM | POA: Diagnosis not present

## 2019-08-10 NOTE — Patient Instructions (Signed)
Medication Instructions:  Your physician recommends that you continue on your current medications as directed. Please refer to the Current Medication list given to you today.  Labwork: None ordered.  Testing/Procedures: None ordered.  Follow-Up: Your physician wants you to follow-up in: one year with Dr. Lovena Le.   You will receive a reminder letter in the mail two months in advance. If you don't receive a letter, please call our office to schedule the follow-up appointment.  Remote monitoring is used to monitor your Pacemaker from home. This monitoring reduces the number of office visits required to check your device to one time per year. It allows [place] to keep an eye on the functioning of your device to ensure it is working properly. You are scheduled for a device check from home on 09/30/2019. You may send your transmission at any time that day. If you have a wireless device, the transmission will be sent automatically. After your physician reviews your transmission, you will receive a postcard with your next transmission date.  Any Other Special Instructions Will Be Listed Below (If Applicable).  If you need a refill on your cardiac medications before your next appointment, please call your pharmacy.

## 2019-08-10 NOTE — Progress Notes (Signed)
HPI Mr. Zietlow returns today for followup of autonomic dysfunction, syncope, LBBB, dementia and s/p PPM insertion. He has done well in the interim. He has not had syncope since his last visit although he does have some falls. He denies chest pain or sob. He continues to suffer from memory decline. His wife who is with him today notes that he has become increasingly sedentary.  Allergies  Allergen Reactions  . Penicillins Other (See Comments)    Patient states that it makes him go "crazy" Has patient had a PCN reaction causing immediate rash, facial/tongue/throat swelling, SOB or lightheadedness with hypotension: Yes Has patient had a PCN reaction causing severe rash involving mucus membranes or skin necrosis: No Has patient had a PCN reaction that required hospitalization No Has patient had a PCN reaction occurring within the last 10 years: No If all of the above answers are "NO", then may proceed with Cephalosporin use.   . Codeine Other (See Comments)    Patient states that it makes him go "crazy"     Current Outpatient Medications  Medication Sig Dispense Refill  . Ascorbic Acid (VITAMIN C) 1000 MG tablet Take 1,000 mg by mouth daily.    . cholecalciferol (VITAMIN D) 1000 UNITS tablet Take 1,000 Units by mouth daily.    . citalopram (CELEXA) 10 MG tablet Take 10 mg by mouth daily.    Marland Kitchen donepezil (ARICEPT) 10 MG tablet Take 10 mg by mouth daily.   1  . fluticasone (FLONASE) 50 MCG/ACT nasal spray Place 1 spray into both nostrils as needed.    . Multiple Vitamins-Minerals (ICAPS PO) Take 1 capsule by mouth daily.    Marland Kitchen OVER THE COUNTER MEDICATION Place 1 drop into the right eye 4 (four) times daily. Patient use eye drops over the counter but don't know the name.    Marland Kitchen OVER THE COUNTER MEDICATION Place 1 drop into the right eye 4 (four) times daily. Patient use over the counter eye ointment but don't know the name.    . pantoprazole (PROTONIX) 40 MG tablet Take 1 tablet (40 mg  total) by mouth 2 (two) times daily before a meal. 60 tablet 0   No current facility-administered medications for this visit.      Past Medical History:  Diagnosis Date  . Arthritis   . Coronary artery disease   . Hx of cardiovascular stress test    Adenosine Myoview (09/2011):  No ischemia, EF 68%, low risk.  Marland Kitchen Hx of echocardiogram    a. Echo (09/2011):  EF 55-60%, mild MR, mild AI, mild LAE, PASP 35;  b. Echo (10/25/2013): Mild LVH, EF 55-60%, normal wall motion, mild AI, mild MR, PASP 43  . LBBB (left bundle branch block)   . Prostate CA ([place])    radiation seeds    ROS:   All systems reviewed and negative except as noted in the HPI.   Past Surgical History:  Procedure Laterality Date  . APPENDECTOMY    . CATARACT EXTRACTION    . EP IMPLANTABLE DEVICE N/A 01/11/2016   Procedure: Pacemaker Implant;  Surgeon: Evans Lance, MD;  Location: [place] [facility];  Service: Cardiovascular;  Laterality: N/A;  . EP IMPLANTABLE DEVICE N/A 01/11/2016   Procedure: Loop Recorder Removal;  Surgeon: Evans Lance, MD;  Location: [place] [facility];  Service: Cardiovascular;  Laterality: N/A;  . LASIK    . LOOP RECORDER IMPLANT N/A 01/12/2015   Procedure: LOOP RECORDER IMPLANT;  Surgeon: Evans Lance, MD;  Location: [facility] CATH [facility];  Service: Cardiovascular;  Laterality: N/A;  . RADIOACTIVE SEED IMPLANT    . ROOT CANAL    . TONSILLECTOMY       Family History  Problem Relation Age of Onset  . Heart attack Mother   . Sudden Cardiac Death Father 8  . Heart attack Sister   . Lung cancer Brother   . Lung cancer Sister   . Colon cancer Sister   . Cancer Sister   . Heart failure Brother      Social History   Socioeconomic History  . Marital status: Married    Spouse name: Not on file  . Number of children: Not on file  . Years of education: Not on file  . Highest education level: Not on file  Occupational History  . Not on file  Social Needs  . Financial resource strain: Not on  file  . Food insecurity    Worry: Not on file    Inability: Not on file  . Transportation needs    Medical: Not on file    Non-medical: Not on file  Tobacco Use  . Smoking status: Never Smoker  . Smokeless tobacco: Never Used  Substance and Sexual Activity  . Alcohol use: No  . Drug use: No  . Sexual activity: Not on file  Lifestyle  . Physical activity    Days per week: Not on file    Minutes per session: Not on file  . Stress: Not on file  Relationships  . Social Herbalist on phone: Not on file    Gets together: Not on file    Attends religious service: Not on file    Active member of club or organization: Not on file    Attends meetings of clubs or organizations: Not on file    Relationship status: Not on file  . Intimate partner violence    Fear of current or ex partner: Not on file    Emotionally abused: Not on file    Physically abused: Not on file    Forced sexual activity: Not on file  Other Topics Concern  . 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             BP (!) 110/54   Pulse 74   Ht 5\' 9"  (1.753 m)   Wt 148 lb (67.1 kg)   SpO2 98%   BMI 21.86 kg/m   Physical Exam:  Well appearing NAD HEENT: Unremarkable Neck:  No JVD, no thyromegally Lymphatics:  No adenopathy Back:  No CVA tenderness Lungs:  Clear with no wheezes HEART:  Regular rate rhythm, no murmurs, no rubs, no clicks Abd:  soft, positive bowel sounds, no organomegally, no rebound, no guarding Ext:  2 plus pulses, no edema, no cyanosis, no clubbing Skin:  No rashes no nodules Neuro:  CN II through XII intact, motor grossly intact  EKG - atrial pacing  DEVICE  Normal device function.  See PaceArt for details.   Assess/Plan: 1. Sinus node dysfunction - he is asymptomatic, s/p PPM insertion. 2. PPM - his Biot[employer] DDD PM is working normally.  3. Autonomic dysfunction - I suspect that it has not gotten worse but remains  present. I encouraged him to increase his physical activity.  [name].D.

## 2019-09-30 ENCOUNTER — Ambulatory Visit (INDEPENDENT_AMBULATORY_CARE_PROVIDER_SITE_OTHER): Payer: Medicare Other | Admitting: *Deleted

## 2019-09-30 DIAGNOSIS — I442 Atrioventricular block, complete: Secondary | ICD-10-CM

## 2019-09-30 LAB — CUP PACEART REMOTE DEVICE CHECK
Battery Remaining Percentage: 75 %
Brady Statistic RA Percent Paced: 35 %
Brady Statistic RV Percent Paced: 0 %
Date Time Interrogation Session: 20201223192518
Implantable Lead Implant Date: 20170406
Implantable Lead Implant Date: 20170406
Implantable Lead Location: 753859
Implantable Lead Location: 753860
Implantable Lead Model: 377
Implantable Lead Model: 377
Implantable Lead Serial Number: 49386458
Implantable Lead Serial Number: 49394856
Implantable Pulse Generator Implant Date: 20170406
Lead Channel Impedance Value: 429 Ohm
Lead Channel Impedance Value: 488 Ohm
Lead Channel Pacing Threshold Amplitude: 1.3 V
Lead Channel Pacing Threshold Pulse Width: 0.4 ms
Lead Channel Sensing Intrinsic Amplitude: 0.5 mV
Lead Channel Sensing Intrinsic Amplitude: 8.9 mV
Lead Channel Setting Pacing Amplitude: 2 V
Lead Channel Setting Pacing Amplitude: 2.4 V
Lead Channel Setting Pacing Pulse Width: 0.4 ms
Pulse Gen Model: 394969
Pulse Gen Serial Number: 68723375

## 2019-10-02 NOTE — Progress Notes (Signed)
PPM remote 

## 2019-12-06 DIAGNOSIS — F320 Major depressive disorder, single episode, mild: Secondary | ICD-10-CM | POA: Diagnosis not present

## 2019-12-06 DIAGNOSIS — J310 Chronic rhinitis: Secondary | ICD-10-CM | POA: Diagnosis not present

## 2019-12-06 DIAGNOSIS — K219 Gastro-esophageal reflux disease without esophagitis: Secondary | ICD-10-CM | POA: Diagnosis not present

## 2019-12-06 DIAGNOSIS — R413 Other amnesia: Secondary | ICD-10-CM | POA: Diagnosis not present

## 2019-12-06 DIAGNOSIS — M150 Primary generalized (osteo)arthritis: Secondary | ICD-10-CM | POA: Diagnosis not present

## 2019-12-30 ENCOUNTER — Ambulatory Visit (INDEPENDENT_AMBULATORY_CARE_PROVIDER_SITE_OTHER): Payer: Medicare Other | Admitting: *Deleted

## 2019-12-30 DIAGNOSIS — I442 Atrioventricular block, complete: Secondary | ICD-10-CM | POA: Diagnosis not present

## 2019-12-30 LAB — CUP PACEART REMOTE DEVICE CHECK
Battery Remaining Percentage: 70 %
Brady Statistic RA Percent Paced: 36 %
Brady Statistic RV Percent Paced: 0 %
Date Time Interrogation Session: 20210325064959
Implantable Lead Implant Date: 20170406
Implantable Lead Implant Date: 20170406
Implantable Lead Location: 753859
Implantable Lead Location: 753860
Implantable Lead Model: 377
Implantable Lead Model: 377
Implantable Lead Serial Number: 49386458
Implantable Lead Serial Number: 49394856
Implantable Pulse Generator Implant Date: 20170406
Lead Channel Impedance Value: 449 Ohm
Lead Channel Impedance Value: 507 Ohm
Lead Channel Pacing Threshold Amplitude: 1.1 V
Lead Channel Pacing Threshold Pulse Width: 0.4 ms
Lead Channel Sensing Intrinsic Amplitude: 1 mV
Lead Channel Sensing Intrinsic Amplitude: 9.6 mV
Lead Channel Setting Pacing Amplitude: 2 V
Lead Channel Setting Pacing Amplitude: 2.4 V
Lead Channel Setting Pacing Pulse Width: 0.4 ms
Pulse Gen Model: 394969
Pulse Gen Serial Number: 68723375

## 2019-12-30 NOTE — Progress Notes (Signed)
PPM Remote  

## 2020-03-16 ENCOUNTER — Emergency Department (HOSPITAL_COMMUNITY)
Admission: EM | Admit: 2020-03-16 | Discharge: 2020-04-06 | Disposition: E | Payer: Medicare Other | Attending: Emergency Medicine | Admitting: Emergency Medicine

## 2020-03-16 DIAGNOSIS — I499 Cardiac arrhythmia, unspecified: Secondary | ICD-10-CM | POA: Diagnosis not present

## 2020-03-16 DIAGNOSIS — Z950 Presence of cardiac pacemaker: Secondary | ICD-10-CM | POA: Diagnosis not present

## 2020-03-16 DIAGNOSIS — Z8546 Personal history of malignant neoplasm of prostate: Secondary | ICD-10-CM | POA: Insufficient documentation

## 2020-03-16 DIAGNOSIS — R0681 Apnea, not elsewhere classified: Secondary | ICD-10-CM | POA: Diagnosis not present

## 2020-03-16 DIAGNOSIS — I469 Cardiac arrest, cause unspecified: Secondary | ICD-10-CM | POA: Insufficient documentation

## 2020-03-16 DIAGNOSIS — I4901 Ventricular fibrillation: Secondary | ICD-10-CM | POA: Diagnosis not present

## 2020-03-16 DIAGNOSIS — I2510 Atherosclerotic heart disease of native coronary artery without angina pectoris: Secondary | ICD-10-CM | POA: Diagnosis not present

## 2020-03-16 DIAGNOSIS — R4020 Unspecified coma: Secondary | ICD-10-CM | POA: Diagnosis not present

## 2020-03-16 DIAGNOSIS — R404 Transient alteration of awareness: Secondary | ICD-10-CM | POA: Diagnosis not present

## 2020-04-06 NOTE — Progress Notes (Signed)
Responded to referral to support family and staff. Pt. Deceased. Accompanied EDR to talk with family. Family was escorted to bedside to visit. Providedd emotional support and ministry of presence.  Jaclynn Major, [place], [facility], Pager [phone]

## 2020-04-06 NOTE — ED Provider Notes (Signed)
[facility] EMERGENCY DEPARTMENT Provider Note   CSN: [id number] Arrival date & time: April 07, 2020  1696     History Chief Complaint  Patient presents with  . Cardiac Arrest    Raymond Hinton is a 86 y.o. male.  The history is provided by the EMS personnel. The history is limited by the absence of a caregiver and the condition of the patient.  Cardiac Arrest Witnessed by:  Family member Incident location:  Home Time since incident:  1 hour Time before BLS initiated:  > 5 minutes Time before ALS initiated:  8-10 minutes Condition upon EMS arrival:  Unresponsive Pulse:  Absent Initial cardiac rhythm per EMS:  Ventricular fibrillation Treatments prior to arrival:  Intubation, vascular access, CCR, AED discharged and ACLS protocol Medications given prior to ED:  Epinephrine and amiodarone Defibrillation prior to arrival:  300 J Number of shocks delivered:  6 Defibrillation successful: no   Rhythm after defibrillation:  PEA Location of pulse detected during CPR: no pulse ever detected. IV access type:  Intraosseous Airway:  Intubation prior to arrival Rhythm on admission to ED:  Agonal Associated symptoms: syncope   Risk factors comment:  Hx of syncope and complete heart block s/p pacemaker, CAD, LBBB      Past Medical History:  Diagnosis Date  . Arthritis   . Coronary artery disease   . Hx of cardiovascular stress test    Adenosine Myoview (09/2011):  No ischemia, EF 68%, low risk.  Marland Kitchen Hx of echocardiogram    a. Echo (09/2011):  EF 55-60%, mild MR, mild AI, mild LAE, PASP 35;  b. Echo (10/25/2013): Mild LVH, EF 55-60%, normal wall motion, mild AI, mild MR, PASP 43  . LBBB (left bundle branch block)   . Prostate CA [facility])    radiation seeds    Patient Active Problem List   Diagnosis Date Noted  . Bradycardia 11/24/2016  . Acute kidney injury ([place]) 11/24/2016  . Prostate CA ([place])   . Coronary artery disease   . Complete heart block ([place]) 01/11/2016  .  Pacemaker 01/11/2016  . SVT (supraventricular tachycardia) ([place]) 01/12/2015  . At risk for diabetes mellitus/borderline diabetes 10/25/2013  . Syncope 10/24/2013  . Leukocytosis 10/24/2013  . Left bundle branch block 09/04/2011  . Atypical chest pain 09/04/2011  . Murmur 09/04/2011    Past Surgical History:  Procedure Laterality Date  . APPENDECTOMY    . CATARACT EXTRACTION    . EP IMPLANTABLE DEVICE N/A 01/11/2016   Procedure: Pacemaker Implant;  Surgeon: Evans Lance, MD;  Location: [place] [facility] LAB;  Service: Cardiovascular;  Laterality: N/A;  . EP IMPLANTABLE DEVICE N/A 01/11/2016   Procedure: Loop Recorder Removal;  Surgeon: Evans Lance, MD;  Location: [place] CV LAB;  Service: Cardiovascular;  Laterality: N/A;  . LASIK    . LOOP RECORDER IMPLANT N/A 01/12/2015   Procedure: LOOP RECORDER IMPLANT;  Surgeon: Evans Lance, MD;  Location: [facility] CATH LAB;  Service: Cardiovascular;  Laterality: N/A;  . RADIOACTIVE SEED IMPLANT    . ROOT CANAL    . TONSILLECTOMY         Family History  Problem Relation Age of Onset  . Heart attack Mother   . Sudden Cardiac Death Father 28  . Heart attack Sister   . Lung cancer Brother   . Lung cancer Sister   . Colon cancer Sister   . Cancer Sister   . Heart failure Brother     Social  History   Tobacco Use  . Smoking status: Never Smoker  . Smokeless tobacco: Never Used  Vaping Use  . Vaping Use: Never used  Substance Use Topics  . Alcohol use: No  . Drug use: No    Home Medications Prior to Admission medications   Medication Sig Start Date End Date Taking? Authorizing Provider  Ascorbic Acid (VITAMIN C) 1000 MG tablet Take 1,000 mg by mouth daily.    [provider]  cholecalciferol (VITAMIN D) 1000 UNITS tablet Take 1,000 Units by mouth daily.    [provider]  citalopram (CELEXA) 10 MG tablet Take 10 mg by mouth daily. 04/14/15   [provider]  donepezil (ARICEPT) 10 MG tablet Take 10 mg by  mouth daily.  12/31/14   [provider]  fluticasone (FLONASE) 50 MCG/ACT nasal spray Place 1 spray into both nostrils as needed.    [provider]  Multiple Vitamins-Minerals (ICAPS PO) Take 1 capsule by mouth daily.    [provider]  OVER THE COUNTER MEDICATION Place 1 drop into the right eye 4 (four) times daily. Patient use eye drops over the counter but don't know the name.    [provider]  OVER THE COUNTER MEDICATION Place 1 drop into the right eye 4 (four) times daily. Patient use over the counter eye ointment but don't know the name.    [provider]  pantoprazole (PROTONIX) 40 MG tablet Take 1 tablet (40 mg total) by mouth 2 (two) times daily before a meal. 11/25/16   Geradine Girt, DO    Allergies    Penicillins and Codeine  Review of Systems   Review of Systems  Unable to perform ROS: Acuity of condition  Cardiovascular: Positive for syncope.    Physical Exam Updated Vital Signs Temp (!) 96.3 F (35.7 C) (Temporal)   Resp (!) 0   Physical Exam Constitutional:      Appearance: He is toxic-appearing.     Interventions: He is intubated.     Comments: Unresponsive, pulseless, cyanotic around ears and nose  HENT:     Head:     Comments: Contusion and abrasions present over the forehead Eyes:     Comments: 2mm and fixed  Cardiovascular:     Comments: Pulses and heart sounds absent Pulmonary:     Effort: He is intubated.     Breath sounds: Normal breath sounds.  Abdominal:     General: Abdomen is flat.     Palpations: Abdomen is soft.  Genitourinary:    Penis: Normal.      Testes: Normal.  Musculoskeletal:     Comments: Skin tears present to the right dorsal hand.  IO present in the left tib/fib  Skin:    Capillary Refill: Capillary refill takes more than 3 seconds.     Comments: cool  Neurological:     Mental Status: He is unresponsive.     GCS: GCS eye subscore is 1. GCS verbal subscore is 1. GCS motor  subscore is 1.  Psychiatric:     Comments: Unresponsive, in Cardiac arrest and intubated     ED Results / Procedures / Treatments   Labs (all labs ordered are listed, but only abnormal results are displayed) Labs Reviewed - No data to display  EKG None  Radiology No results found.  Procedures Procedures (including critical care time)  Medications Ordered in ED Medications - No data to display  ED Course  I have reviewed the triage  vital signs and the nursing notes.  Pertinent labs & imaging results that were available during my care of the patient were reviewed by me and considered in my medical decision making (see chart for details).    MDM Rules/Calculators/A&P                          Elderly male with a history of coronary artery disease, complete heart block with pacemaker and history of syncope presenting today after a witnessed arrest at home.  Patient was in the bathtub when wife thought he syncopized but was found to be in cardiac arrest.  Patient had 10 minutes of downtime before CPR was started by fire.  When they arrived he was in V. fib and underwent 6 shocks, 9 epi and 300 of amiodarone with change from V. fib to PEA.  Patient's end-tidal was 15 was intubated in the field due to failed [facility] airway with equal breath sounds bilaterally and easy to ventilate.  Patient's blood sugar was 170.  Upon arrival to the emergency room he had been receiving CPR for 1 hour without return of spontaneous circulation at any time.  Upon arrival here patient remained in an agonal rhythm with no palpable pulse.  Time of death was called at 947.  Spoke with ME who agreed that patient was not a medical examiner case.  Spoke with family and they were notified of patient's passing.  Final Clinical Impression(s) / ED Diagnoses Final diagnoses:  Cardiac arrest with ventricular fibrillation [facility])    Rx / DC Orders ED Discharge Orders    None       Blanchie Dessert, MD 2020-04-14  1003

## 2020-04-06 NOTE — ED Triage Notes (Signed)
Pt bib ems from home after witnessed fall at 0838. CPR initiated at [place] by [place]. Pt in course Vfib and defib X6. Given 450mg  amiodarone and 9 epi. Pt pea rate of 20 on arrival. Capnography 15 on arrival.

## 2020-04-06 DEATH — deceased
